# Patient Record
Sex: Female | Born: 1960 | Race: White | Hispanic: No | Smoking: Never smoker
Health system: Southern US, Community
[De-identification: ages and names within clinical notes are randomized; demographics above are authoritative.]

## PROBLEM LIST (undated history)

## (undated) DIAGNOSIS — C189 Malignant neoplasm of colon, unspecified: Secondary | ICD-10-CM

## (undated) DIAGNOSIS — Z789 Other specified health status: Secondary | ICD-10-CM

## (undated) HISTORY — DX: Malignant neoplasm of colon, unspecified: C18.9

---

## 2007-06-28 ENCOUNTER — Ambulatory Visit: Payer: Self-pay

## 2010-07-17 ENCOUNTER — Ambulatory Visit: Payer: Self-pay

## 2010-12-29 ENCOUNTER — Ambulatory Visit (INDEPENDENT_AMBULATORY_CARE_PROVIDER_SITE_OTHER): Payer: BC Managed Care – PPO | Admitting: Internal Medicine

## 2010-12-29 ENCOUNTER — Encounter: Payer: Self-pay | Admitting: Internal Medicine

## 2010-12-29 DIAGNOSIS — N951 Menopausal and female climacteric states: Secondary | ICD-10-CM

## 2010-12-29 DIAGNOSIS — Z8639 Personal history of other endocrine, nutritional and metabolic disease: Secondary | ICD-10-CM | POA: Insufficient documentation

## 2010-12-29 DIAGNOSIS — E039 Hypothyroidism, unspecified: Secondary | ICD-10-CM

## 2010-12-29 DIAGNOSIS — Z124 Encounter for screening for malignant neoplasm of cervix: Secondary | ICD-10-CM

## 2010-12-29 DIAGNOSIS — Z13220 Encounter for screening for lipoid disorders: Secondary | ICD-10-CM

## 2010-12-29 DIAGNOSIS — Z1211 Encounter for screening for malignant neoplasm of colon: Secondary | ICD-10-CM

## 2010-12-29 DIAGNOSIS — R5381 Other malaise: Secondary | ICD-10-CM

## 2010-12-29 DIAGNOSIS — R5383 Other fatigue: Secondary | ICD-10-CM

## 2010-12-29 DIAGNOSIS — E063 Autoimmune thyroiditis: Secondary | ICD-10-CM

## 2010-12-29 NOTE — Assessment & Plan Note (Signed)
currenlty resolved per patient.  Will repeat thyroid studies and CK in early January

## 2010-12-29 NOTE — Assessment & Plan Note (Signed)
Normal May 2011.  Repeat due next year

## 2010-12-29 NOTE — Patient Instructions (Signed)
check to see if you received the TdaP vaccine from th Er or Tetanus toxoid  In 2009.  Return for fasting labs in January and we'll check muscle enzyme , hormones, etc.

## 2010-12-29 NOTE — Assessment & Plan Note (Signed)
Referral for screening colonoscopy made.

## 2010-12-29 NOTE — Progress Notes (Signed)
  Subjective:    Patient ID: Crystal Erickson, female    DOB: 29-Apr-1960, 50 y.o.   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  Sent to [facility] for thyrid scan,  July 5th no uptake. C/w Hashimoto' s thyroiditis,  currenlty not on meds vc thyroid has normalized.  Only symptoms was tachycardia, now resolved.    Vewry healthy 3 uncomplicated VDs,  Oldest son is 85,  youngest is 89.  History  of insomnia severe started 4 weeks ago aggravated by night sweats ,  Now improving.  She is perimenopasual,  Last period August.  Using melatonin.  No incotience issues.  Normal PAPs, mammogram in June  2012   PAP   May 2012   Review of Systems  Constitutional: Negative for fever, chills and unexpected weight change.  HENT: Negative for hearing loss, ear pain, nosebleeds, congestion, sore throat, facial swelling, rhinorrhea, sneezing, mouth sores, trouble swallowing, neck pain, neck stiffness, voice change, postnasal drip, sinus pressure, tinnitus and ear discharge.   Eyes: Negative for pain, discharge, redness and visual disturbance.  Respiratory: Negative for cough, chest tightness, shortness of breath, wheezing and stridor.   Cardiovascular: Negative for chest pain, palpitations and leg swelling.  Musculoskeletal: Negative for myalgias and arthralgias.  Skin: Negative for color change and rash.  Neurological: Negative for dizziness, weakness, light-headedness and headaches.  Hematological: Negative for adenopathy.       Objective:   Physical Exam  Constitutional: She is oriented to person, place, and time. She appears well-developed and well-nourished.  HENT:  Mouth/Throat: Oropharynx is clear and moist.  Eyes: EOM are normal. Pupils are equal, round, and reactive to light. No scleral icterus.  Neck: Normal range of motion. Neck supple. No JVD present. No thyromegaly present.  Cardiovascular: Normal rate, regular rhythm, normal  heart sounds and intact distal pulses.   Pulmonary/Chest: Effort normal and breath sounds normal.  Abdominal: Soft. Bowel sounds are normal. She exhibits no mass. There is no tenderness.  Musculoskeletal: Normal range of motion. She exhibits no edema.  Lymphadenopathy:    She has no cervical adenopathy.  Neurological: She is alert and oriented to person, place, and time.  Skin: Skin is warm and dry.  Psychiatric: She has a normal mood and affect.          Assessment & Plan:

## 2011-07-24 ENCOUNTER — Encounter: Payer: Self-pay | Admitting: Internal Medicine

## 2011-07-24 ENCOUNTER — Ambulatory Visit (INDEPENDENT_AMBULATORY_CARE_PROVIDER_SITE_OTHER): Payer: BC Managed Care – PPO | Admitting: Internal Medicine

## 2011-07-24 ENCOUNTER — Other Ambulatory Visit (HOSPITAL_COMMUNITY)
Admission: RE | Admit: 2011-07-24 | Discharge: 2011-07-24 | Disposition: A | Discharge status: Home / Self Care | Payer: BC Managed Care – PPO | Source: Ambulatory Visit | Attending: Internal Medicine | Admitting: Internal Medicine

## 2011-07-24 VITALS — BP 102/64 | HR 69 | Temp 98.7°F | Resp 18 | Ht 64.0 in | Wt 138.0 lb

## 2011-07-24 DIAGNOSIS — Z01419 Encounter for gynecological examination (general) (routine) without abnormal findings: Secondary | ICD-10-CM | POA: Insufficient documentation

## 2011-07-24 DIAGNOSIS — N760 Acute vaginitis: Secondary | ICD-10-CM | POA: Insufficient documentation

## 2011-07-24 DIAGNOSIS — Z124 Encounter for screening for malignant neoplasm of cervix: Secondary | ICD-10-CM

## 2011-07-24 DIAGNOSIS — Z1239 Encounter for other screening for malignant neoplasm of breast: Secondary | ICD-10-CM

## 2011-07-24 DIAGNOSIS — E069 Thyroiditis, unspecified: Secondary | ICD-10-CM

## 2011-07-24 DIAGNOSIS — Z8639 Personal history of other endocrine, nutritional and metabolic disease: Secondary | ICD-10-CM

## 2011-07-24 DIAGNOSIS — Z0000 Encounter for general adult medical examination without abnormal findings: Secondary | ICD-10-CM | POA: Insufficient documentation

## 2011-07-24 DIAGNOSIS — L988 Other specified disorders of the skin and subcutaneous tissue: Secondary | ICD-10-CM

## 2011-07-24 DIAGNOSIS — Z1151 Encounter for screening for human papillomavirus (HPV): Secondary | ICD-10-CM | POA: Insufficient documentation

## 2011-07-24 DIAGNOSIS — N649 Disorder of breast, unspecified: Secondary | ICD-10-CM

## 2011-07-24 DIAGNOSIS — Z862 Personal history of diseases of the blood and blood-forming organs and certain disorders involving the immune mechanism: Secondary | ICD-10-CM

## 2011-07-24 NOTE — Patient Instructions (Addendum)
Return at your lesiure for fasting labs (ok to drink water,   8 hr fast is plenty)  Let me know which MD for your colonoscopy if yo have a preference   Mammogram

## 2011-07-26 ENCOUNTER — Encounter: Payer: Self-pay | Admitting: Internal Medicine

## 2011-07-26 NOTE — Assessment & Plan Note (Signed)
Breast pelvic and PAP done today.  mammogram ordered, colonoscopy referral in process for colon CA screening

## 2011-07-26 NOTE — Assessment & Plan Note (Signed)
Done today, last one over 2 years ago.

## 2011-07-26 NOTE — Assessment & Plan Note (Signed)
Annual TSH and Free T4 ordered.

## 2011-07-26 NOTE — Progress Notes (Signed)
Patient ID: Roanna Reaves, female   DOB: Apr 05, 1960, 51 y.o.   MRN: [id number]  Subjective:    Miquel Lamson is a 51 y.o. female who presents for an annual exam. The patient has no complaints today. The patient is sexually active. GYN screening history: last pap: approximate date 2011 and was normal. The patient wears seatbelts: yes. The patient participates in regular exercise: yes. Has the patient ever been transfused or tattooed?: no. The patient reports that there is not domestic violence in her life.   Menstrual History: OB History    Grav Para Term Preterm Abortions TAB SAB Ect Mult Living                  Menarche age: 20 Patient's last menstrual period was 08/13/2010.    The following portions of the patient's history were reviewed and updated as appropriate: allergies, current medications, past family history, past medical history, past social history, past surgical history and problem list.  Review of Systems A comprehensive review of systems was negative except for: Integument/breast: positive for skin lesion(s)    Objective:    BP 102/64  Pulse 69  Temp 98.7 F (37.1 C) (Oral)  Resp 18  Ht 5\' 4"  (1.626 m)  Wt 138 lb (62.596 kg)  BMI 23.69 kg/m2  SpO2 97%  LMP 08/13/2010  General Appearance:    Alert, cooperative, no distress, appears stated age  Head:    Normocephalic, without obvious abnormality, atraumatic  Eyes:    PERRL, conjunctiva/corneas clear, EOM's intact, fundi    benign, both eyes  Ears:    Normal TM's and external ear canals, both ears  Nose:   Nares normal, septum midline, mucosa normal, no drainage    or sinus tenderness  Throat:   Lips, mucosa, and tongue normal; teeth and gums normal  Neck:   Supple, symmetrical, trachea midline, no adenopathy;    thyroid:  no enlargement/tenderness/nodules; no carotid   bruit or JVD  Back:     Symmetric, no curvature, ROM normal, no CVA tenderness  Lungs:     Clear to auscultation bilaterally, respirations  unlabored  Chest Wall:    No tenderness or deformity   Heart:    Regular rate and rhythm, S1 and S2 normal, no murmur, rub   or gallop  Breast Exam:    No tenderness, masses, or nipple abnormality  Abdomen:     Soft, non-tender, bowel sounds active all four quadrants,    no masses, no organomegaly  Genitalia:    Normal female without lesion, discharge or tenderness     Pulses:   2+ and symmetric all extremities  Skin:   Skin color tanned and freckles in sun exposed areas, texture, turgor normal, several annular < 1 cm hyperpigmented lesion concerning for precancerous lesions (left shoulder and a left thigh)  Lymph nodes:   Cervical, supraclavicular, and axillary nodes normal  Neurologic:   CNII-XII intact, normal strength, sensation and reflexes    throughout  .      Assessment and Plan  Screening for cervical cancer Done today, last one over 2 years ago.  Routine general medical examination at a health care facility Breast pelvic and PAP done today.  mammogram ordered, colonoscopy referral in process for colon CA screening  History of Hashimoto thyroiditis Annual TSH and Free T4 ordered.    Updated Medication List Outpatient Encounter Prescriptions as of 07/24/2011  Medication Sig Dispense Refill  . Ascorbic Acid (VITAMIN C) 1000 MG tablet Take 1,000  mg by mouth 4 (four) times daily.        Marland Kitchen b complex vitamins tablet Take 1 tablet by mouth 2 (two) times daily.        . cholecalciferol (VITAMIN D) 1000 UNITS tablet Take 1,000 Units by mouth daily.        . Glucosamine-Chondroit-Vit C-Mn (GLUCOSAMINE 1500 COMPLEX PO) Take by mouth 2 (two) times daily.        Marland Kitchen GLUTAMINE PO Take by mouth 3 (three) times daily.        . Multiple Vitamins-Minerals (MULTIVITAMIN PO) Take by mouth daily.        . Omega-3 Fatty Acids (FISH OIL MAXIMUM STRENGTH) 1200 MG CPDR Take by mouth 2 (two) times daily.        . Pyridoxine HCl (VITAMIN B-6 PO) Take by mouth 2 (two) times daily.

## 2011-08-03 ENCOUNTER — Other Ambulatory Visit: Payer: BC Managed Care – PPO

## 2011-08-04 ENCOUNTER — Other Ambulatory Visit: Payer: BC Managed Care – PPO

## 2011-08-06 ENCOUNTER — Encounter: Payer: Self-pay | Admitting: Internal Medicine

## 2011-08-07 ENCOUNTER — Other Ambulatory Visit (INDEPENDENT_AMBULATORY_CARE_PROVIDER_SITE_OTHER): Payer: BC Managed Care – PPO | Admitting: *Deleted

## 2011-08-07 DIAGNOSIS — E039 Hypothyroidism, unspecified: Secondary | ICD-10-CM

## 2011-08-07 DIAGNOSIS — N951 Menopausal and female climacteric states: Secondary | ICD-10-CM

## 2011-08-07 DIAGNOSIS — R5383 Other fatigue: Secondary | ICD-10-CM

## 2011-08-07 DIAGNOSIS — Z13220 Encounter for screening for lipoid disorders: Secondary | ICD-10-CM

## 2011-08-07 DIAGNOSIS — R5381 Other malaise: Secondary | ICD-10-CM

## 2011-08-07 DIAGNOSIS — E069 Thyroiditis, unspecified: Secondary | ICD-10-CM

## 2011-08-07 LAB — TSH: TSH: 1.03 u[IU]/mL (ref 0.35–5.50)

## 2011-08-07 LAB — LIPID PANEL: Triglycerides: 45 mg/dL (ref 0.0–149.0)

## 2011-08-07 LAB — COMPREHENSIVE METABOLIC PANEL
Albumin: 3.6 g/dL (ref 3.5–5.2)
CO2: 26 mEq/L (ref 19–32)
Calcium: 8.7 mg/dL (ref 8.4–10.5)
Chloride: 104 mEq/L (ref 96–112)
GFR: 89.28 mL/min (ref >60.00)
Glucose, Bld: 92 mg/dL (ref 70–99)
Potassium: 4 mEq/L (ref 3.5–5.1)
Sodium: 138 mEq/L (ref 135–145)
Total Protein: 6.1 g/dL (ref 6.0–8.3)

## 2011-08-07 LAB — FOLLICLE STIMULATING HORMONE: FSH: 80.2 m[IU]/mL

## 2011-09-04 ENCOUNTER — Encounter: Payer: Self-pay | Admitting: Internal Medicine

## 2012-07-25 ENCOUNTER — Encounter: Payer: Self-pay | Admitting: Internal Medicine

## 2012-07-25 ENCOUNTER — Encounter: Payer: Self-pay | Admitting: *Deleted

## 2012-07-25 ENCOUNTER — Ambulatory Visit (INDEPENDENT_AMBULATORY_CARE_PROVIDER_SITE_OTHER): Payer: Self-pay | Admitting: Internal Medicine

## 2012-07-25 VITALS — BP 126/78 | HR 68 | Temp 98.1°F | Resp 12 | Ht 64.0 in | Wt 138.5 lb

## 2012-07-25 DIAGNOSIS — Z0000 Encounter for general adult medical examination without abnormal findings: Secondary | ICD-10-CM

## 2012-07-25 DIAGNOSIS — R748 Abnormal levels of other serum enzymes: Secondary | ICD-10-CM | POA: Insufficient documentation

## 2012-07-25 DIAGNOSIS — Z1239 Encounter for other screening for malignant neoplasm of breast: Secondary | ICD-10-CM

## 2012-07-25 DIAGNOSIS — Z1211 Encounter for screening for malignant neoplasm of colon: Secondary | ICD-10-CM

## 2012-07-25 DIAGNOSIS — Z124 Encounter for screening for malignant neoplasm of cervix: Secondary | ICD-10-CM

## 2012-07-25 LAB — HEPATIC FUNCTION PANEL
ALT: 59 U/L — ABNORMAL HIGH (ref 0–35)
Total Protein: 6.2 g/dL (ref 6.0–8.3)

## 2012-07-25 NOTE — Assessment & Plan Note (Signed)
Annual comprehensive exam was done including breast, pelvic exam. All screenings have been addressed .  

## 2012-07-25 NOTE — Patient Instructions (Addendum)
You had your annual  wellness exam today  We will schedule your mammogram today and your colonoscopy with Dr Donnalee Curry  We will contact you with the bloodwork results

## 2012-07-25 NOTE — Assessment & Plan Note (Signed)
liver enzyme remains elevated.  She is a Systems analyst and exercises heavily but I have recommended that she return  for additional blood tests to rule out various infections and autoimmune disorders that can elevated liver enzymes (hepatitis, hemochromatosis, etc)

## 2012-07-25 NOTE — Assessment & Plan Note (Signed)
Referral to [facility] for colonoscopy.

## 2012-07-25 NOTE — Progress Notes (Signed)
Patient ID: Crystal Erickson, female   DOB: 1961-01-03, 52 y.o.   MRN: [id number]  Subjective:     Crystal Erickson is a 52 y.o. female and is here for a comprehensive physical exam. The patient reports no problems.  History   Social History  . Marital Status: Divorced    Spouse Name: N/A    Number of Children: N/A  . Years of Education: N/A   Occupational History  . personal trainer    Social History Main Topics  . Smoking status: Never Smoker   . Smokeless tobacco: Never Used  . Alcohol Use: 0.0 oz/week    0 drink(s) per week  . Drug Use: No  . Sexually Active: Not on file   Other Topics Concern  . Not on file   Social History Narrative  . No narrative on file   Health Maintenance  Topic Date Due  . Tetanus/tdap  06/17/1979  . Colonoscopy  06/17/2010  . Influenza Vaccine  09/12/2012  . Mammogram  07/27/2013  . Pap Smear  07/24/2014    The following portions of the patient's history were reviewed and updated as appropriate: allergies, current medications, past family history, past medical history, past social history, past surgical history and problem list.  Review of Systems A comprehensive review of systems was negative.   Objective:   BP 126/78  Pulse 68  Temp(Src) 98.1 F (36.7 C) (Oral)  Resp 12  Ht 5\' 4"  (1.626 m)  Wt 138 lb 8 oz (62.823 kg)  BMI 23.76 kg/m2  SpO2 97%  LMP 08/13/2010  General Appearance:    Alert, cooperative, no distress, appears stated age  Head:    Normocephalic, without obvious abnormality, atraumatic  Eyes:    PERRL, conjunctiva/corneas clear, EOM's intact, fundi    benign, both eyes  Ears:    Normal TM's and external ear canals, both ears  Nose:   Nares normal, septum midline, mucosa normal, no drainage    or sinus tenderness  Throat:   Lips, mucosa, and tongue normal; teeth and gums normal  Neck:   Supple, symmetrical, trachea midline, no adenopathy;    thyroid:  no enlargement/tenderness/nodules; no carotid   bruit or JVD   Back:     Symmetric, no curvature, ROM normal, no CVA tenderness  Lungs:     Clear to auscultation bilaterally, respirations unlabored  Chest Wall:    No tenderness or deformity   Heart:    Regular rate and rhythm, S1 and S2 normal, no murmur, rub   or gallop  Breast Exam:    No tenderness, masses, or nipple abnormality  Abdomen:     Soft, non-tender, bowel sounds active all four quadrants,    no masses, no organomegaly  Genitalia:    Pelvic: cervix normal in appearance, external genitalia normal, no adnexal masses or tenderness, no cervical motion tenderness, rectovaginal septum normal, uterus normal size, shape, and consistency and vagina normal without discharge  Extremities:   Extremities normal, atraumatic, no cyanosis or edema  Pulses:   2+ and symmetric all extremities  Skin:   Skin color, texture, turgor normal, no rashes or lesions  Lymph nodes:   Cervical, supraclavicular, and axillary nodes normal  Neurologic:   CNII-XII intact, normal strength, sensation and reflexes    throughout     Assessment:   Elevated liver enzymes  liver enzyme remains elevated.  She is a Systems analyst and exercises heavily but I have recommended that she return  for additional blood tests to  rule out various infections and autoimmune disorders that can elevated liver enzymes (hepatitis, hemochromatosis, etc)   Routine general medical examination at a health care facility Annual comprehensive exam was done including breast, pelvic exam. All screenings have been addressed .   Screening for cervical cancer Normal PAP smear 2013.  Screening for colon cancer Referral to [facility] for colonoscopy.    Updated Medication List Outpatient Encounter Prescriptions as of 07/25/2012  Medication Sig Dispense Refill  . Ascorbic Acid (VITAMIN C) 1000 MG tablet Take 1,000 mg by mouth 4 (four) times daily.        Marland Kitchen b complex vitamins tablet Take 1 tablet by mouth 2 (two) times daily.        .  Glucosamine-Chondroit-Vit C-Mn (GLUCOSAMINE 1500 COMPLEX PO) Take by mouth 2 (two) times daily.        Marland Kitchen GLUTAMINE PO Take by mouth 3 (three) times daily.        . Multiple Vitamins-Minerals (MULTIVITAMIN PO) Take by mouth daily.        . Omega-3 Fatty Acids (FISH OIL MAXIMUM STRENGTH) 1200 MG CPDR Take by mouth 2 (two) times daily.        . Pyridoxine HCl (VITAMIN B-6 PO) Take by mouth 2 (two) times daily.        . cholecalciferol (VITAMIN D) 1000 UNITS tablet Take 1,000 Units by mouth daily.         No facility-administered encounter medications on file as of 07/25/2012.

## 2012-07-25 NOTE — Assessment & Plan Note (Signed)
Normal PAP smear 2013.

## 2012-08-15 ENCOUNTER — Encounter: Payer: Self-pay | Admitting: General Surgery

## 2012-08-15 ENCOUNTER — Ambulatory Visit (INDEPENDENT_AMBULATORY_CARE_PROVIDER_SITE_OTHER): Payer: BC Managed Care – PPO | Admitting: General Surgery

## 2012-08-15 ENCOUNTER — Other Ambulatory Visit: Payer: Self-pay | Admitting: General Surgery

## 2012-08-15 VITALS — BP 110/80 | HR 76 | Resp 12 | Ht 64.0 in | Wt 136.0 lb

## 2012-08-15 DIAGNOSIS — Z1211 Encounter for screening for malignant neoplasm of colon: Secondary | ICD-10-CM

## 2012-08-15 MED ORDER — POLYETHYLENE GLYCOL 3350 17 GM/SCOOP PO POWD: ORAL | Status: DC

## 2012-08-15 NOTE — Patient Instructions (Addendum)
Colonoscopy A colonoscopy is an exam to evaluate your entire colon. In this exam, your colon is cleansed. A long fiberoptic tube is inserted through your rectum and into your colon. The fiberoptic scope (endoscope) is a long bundle of enclosed and very flexible fibers. These fibers transmit light to the area examined and send images from that area to your caregiver. Discomfort is usually minimal. You may be given a drug to help you sleep (sedative) during or prior to the procedure. This exam helps to detect lumps (tumors), polyps, inflammation, and areas of bleeding. Your caregiver may also take a small piece of tissue (biopsy) that will be examined under a microscope. LET YOUR CAREGIVER KNOW ABOUT:   Allergies to food or medicine.  Medicines taken, including vitamins, herbs, eyedrops, over-the-counter medicines, and creams.  Use of steroids (by mouth or creams).  Previous problems with anesthetics or numbing medicines.  History of bleeding problems or blood clots.  Previous surgery.  Other health problems, including diabetes and kidney problems.  Possibility of pregnancy, if this applies. BEFORE THE PROCEDURE   A clear liquid diet may be required for 2 days before the exam.  Ask your caregiver about changing or stopping your regular medications.  Liquid injections (enemas) or laxatives may be required.  A large amount of electrolyte solution may be given to you to drink over a short period of time. This solution is used to clean out your colon.  You should be present 60 minutes prior to your procedure or as directed by your caregiver. AFTER THE PROCEDURE   If you received a sedative or pain relieving medication, you will need to arrange for someone to drive you home.  Occasionally, there is a little blood passed with the first bowel movement. Do not be concerned. FINDING OUT THE RESULTS OF YOUR TEST Not all test results are available during your visit. If your test results are  not back during the visit, make an appointment with your caregiver to find out the results. Do not assume everything is normal if you have not heard from your caregiver or the medical facility. It is important for you to follow up on all of your test results. HOME CARE INSTRUCTIONS   It is not unusual to pass moderate amounts of gas and experience mild abdominal cramping following the procedure. This is due to air being used to inflate your colon during the exam. Walking or a warm pack on your belly (abdomen) may help.  You may resume all normal meals and activities after sedatives and medicines have worn off.  Only take over-the-counter or prescription medicines for pain, discomfort, or fever as directed by your caregiver. Do not use aspirin or blood thinners if a biopsy was taken. Consult your caregiver for medicine usage if biopsies were taken. SEEK IMMEDIATE MEDICAL CARE IF:   You have a fever.  You pass large blood clots or fill a toilet with blood following the procedure. This may also occur 10 to 14 days following the procedure. This is more likely if a biopsy was taken.  You develop abdominal pain that keeps getting worse and cannot be relieved with medicine. Document Released: 12/27/1999 Document Revised: 03/23/2011 Document Reviewed: 08/11/2007 [facility] Patient Information 2014 [place], [place].  Patient has been scheduled for a colonoscopy on 09-28-12 at [facility]. This patient has been asked to discontinue fish oil one week prior to procedure.

## 2012-08-15 NOTE — Progress Notes (Signed)
Patient ID: Crystal Erickson, female   DOB: 02-Jul-1960, 52 y.o.   MRN: [id number]  Chief Complaint  Patient presents with  . Other    new patient evaluation for screening colonoscopy    HPI Crystal Erickson is a 52 y.o. female who presents for an evaluation for a screening colonoscopy non prior.Patient states no family history of colon ca. HPI  History reviewed. No pertinent past medical history.  History reviewed. No pertinent past surgical history.   Family History  Problem Relation Age of Onset  . Mental illness Mother 19    alzheimer's  . Cancer Mother     metastatic unknown primary  . Diabetes Father     Social History History  Substance Use Topics  . Smoking status: Never Smoker   . Smokeless tobacco: Never Used  . Alcohol Use: 0.0 oz/week    0 drink(s) per week    No Known Allergies  Current Outpatient Prescriptions  Medication Sig Dispense Refill  . Ascorbic Acid (VITAMIN C) 1000 MG tablet Take 1,000 mg by mouth 4 (four) times daily.        Marland Kitchen b complex vitamins tablet Take 1 tablet by mouth 2 (two) times daily.        . cholecalciferol (VITAMIN D) 1000 UNITS tablet Take 1,000 Units by mouth daily.        . Glucosamine-Chondroit-Vit C-Mn (GLUCOSAMINE 1500 COMPLEX PO) Take by mouth 2 (two) times daily.        Marland Kitchen GLUTAMINE PO Take by mouth 3 (three) times daily.        . Multiple Vitamins-Minerals (MULTIVITAMIN PO) Take by mouth daily.        . Omega-3 Fatty Acids (FISH OIL MAXIMUM STRENGTH) 1200 MG CPDR Take by mouth 2 (two) times daily.        . Pyridoxine HCl (VITAMIN B-6 PO) Take by mouth 2 (two) times daily.        . polyethylene glycol powder (GLYCOLAX/MIRALAX) powder 255 grams one bottle for colonoscopy prep  255 g  0   No current facility-administered medications for this visit.    Review of Systems Review of Systems  Constitutional: Negative.   Respiratory: Negative.   Cardiovascular: Negative.   Gastrointestinal: Negative.     Blood pressure 110/80, pulse  76, resp. rate 12, height 5\' 4"  (1.626 m), weight 136 lb (61.689 kg), last menstrual period 08/13/2010.  Physical Exam Physical Exam  Constitutional: She is oriented to person, place, and time. She appears well-developed and well-nourished.  Cardiovascular: Normal rate, regular rhythm and normal heart sounds.   Pulmonary/Chest: Breath sounds normal.  Lymphadenopathy:    She has no cervical adenopathy.  Neurological: She is alert and oriented to person, place, and time.  Skin: Skin is warm and dry.    Data Reviewed PCP notes  Assessment    Candidate for screening endoscopy.    Plan    Indications for the procedure were reviewed. Risks and benefits including those related to bleeding and perforation were discussed. The need to have a driver the day of the procedure was reviewed.    Patient has been scheduled for a colonoscopy on 09-28-12 at [facility]. This patient has been asked to discontinue fish oil one week prior to procedure.   Earline Mayotte 08/15/2012, 6:47 PM

## 2012-09-09 ENCOUNTER — Ambulatory Visit
Admission: RE | Admit: 2012-09-09 | Discharge: 2012-09-09 | Disposition: A | Discharge status: Home / Self Care | Payer: BC Managed Care – PPO | Source: Ambulatory Visit | Attending: Internal Medicine | Admitting: Internal Medicine

## 2012-09-09 DIAGNOSIS — Z1239 Encounter for other screening for malignant neoplasm of breast: Secondary | ICD-10-CM

## 2012-09-16 ENCOUNTER — Other Ambulatory Visit: Payer: BC Managed Care – PPO

## 2012-09-21 ENCOUNTER — Telehealth: Payer: Self-pay | Admitting: *Deleted

## 2012-09-21 NOTE — Telephone Encounter (Signed)
Patient reports no medication changes since last office visit. She was instructed to pre-register since she has not done so already. We will proceed with colonoscopy that is scheduled at [facility] for 09-28-12. Patient to call the office if she has further questions.

## 2012-09-28 ENCOUNTER — Ambulatory Visit: Payer: Self-pay | Admitting: General Surgery

## 2012-09-28 DIAGNOSIS — Z1211 Encounter for screening for malignant neoplasm of colon: Secondary | ICD-10-CM

## 2012-09-28 LAB — HM COLONOSCOPY: HM Colonoscopy: NORMAL

## 2012-09-29 ENCOUNTER — Encounter: Payer: Self-pay | Admitting: General Surgery

## 2012-10-03 ENCOUNTER — Telehealth: Payer: Self-pay | Admitting: Internal Medicine

## 2012-11-17 ENCOUNTER — Other Ambulatory Visit: Payer: Self-pay

## 2013-05-01 ENCOUNTER — Ambulatory Visit (INDEPENDENT_AMBULATORY_CARE_PROVIDER_SITE_OTHER): Payer: BC Managed Care – PPO | Admitting: Internal Medicine

## 2013-05-01 ENCOUNTER — Encounter: Payer: Self-pay | Admitting: Internal Medicine

## 2013-05-01 VITALS — BP 114/76 | HR 72 | Temp 98.3°F | Resp 18 | Wt 136.5 lb

## 2013-05-01 DIAGNOSIS — R5383 Other fatigue: Secondary | ICD-10-CM

## 2013-05-01 DIAGNOSIS — R142 Eructation: Secondary | ICD-10-CM

## 2013-05-01 DIAGNOSIS — R143 Flatulence: Secondary | ICD-10-CM

## 2013-05-01 DIAGNOSIS — F489 Nonpsychotic mental disorder, unspecified: Secondary | ICD-10-CM

## 2013-05-01 DIAGNOSIS — Z862 Personal history of diseases of the blood and blood-forming organs and certain disorders involving the immune mechanism: Secondary | ICD-10-CM

## 2013-05-01 DIAGNOSIS — R634 Abnormal weight loss: Secondary | ICD-10-CM

## 2013-05-01 DIAGNOSIS — F419 Anxiety disorder, unspecified: Secondary | ICD-10-CM

## 2013-05-01 DIAGNOSIS — F418 Other specified anxiety disorders: Secondary | ICD-10-CM

## 2013-05-01 DIAGNOSIS — R748 Abnormal levels of other serum enzymes: Secondary | ICD-10-CM

## 2013-05-01 DIAGNOSIS — R141 Gas pain: Secondary | ICD-10-CM

## 2013-05-01 DIAGNOSIS — M791 Myalgia, unspecified site: Secondary | ICD-10-CM

## 2013-05-01 DIAGNOSIS — F341 Dysthymic disorder: Secondary | ICD-10-CM

## 2013-05-01 DIAGNOSIS — R140 Abdominal distension (gaseous): Secondary | ICD-10-CM

## 2013-05-01 DIAGNOSIS — F411 Generalized anxiety disorder: Secondary | ICD-10-CM

## 2013-05-01 DIAGNOSIS — Z780 Asymptomatic menopausal state: Secondary | ICD-10-CM

## 2013-05-01 DIAGNOSIS — IMO0001 Reserved for inherently not codable concepts without codable children: Secondary | ICD-10-CM

## 2013-05-01 DIAGNOSIS — N951 Menopausal and female climacteric states: Secondary | ICD-10-CM

## 2013-05-01 DIAGNOSIS — Z8639 Personal history of other endocrine, nutritional and metabolic disease: Secondary | ICD-10-CM

## 2013-05-01 DIAGNOSIS — R5381 Other malaise: Secondary | ICD-10-CM

## 2013-05-01 DIAGNOSIS — F5105 Insomnia due to other mental disorder: Secondary | ICD-10-CM

## 2013-05-01 LAB — COMPREHENSIVE METABOLIC PANEL
ALBUMIN: 3.3 g/dL — AB (ref 3.5–5.2)
ALK PHOS: 72 U/L (ref 39–117)
ALT: 50 U/L — AB (ref 0–35)
AST: 58 U/L — ABNORMAL HIGH (ref 0–37)
BUN: 28 mg/dL — ABNORMAL HIGH (ref 6–23)
CO2: 29 mEq/L (ref 19–32)
Calcium: 9.3 mg/dL (ref 8.4–10.5)
Chloride: 102 mEq/L (ref 96–112)
Creatinine, Ser: 0.7 mg/dL (ref 0.4–1.2)
GFR: 94.64 mL/min (ref >60.00)
GLUCOSE: 88 mg/dL (ref 70–99)
POTASSIUM: 3.9 meq/L (ref 3.5–5.1)
SODIUM: 139 meq/L (ref 135–145)
TOTAL PROTEIN: 5.8 g/dL — AB (ref 6.0–8.3)
Total Bilirubin: 0.9 mg/dL (ref 0.3–1.2)

## 2013-05-01 LAB — CBC WITH DIFFERENTIAL/PLATELET
BASOS ABS: 0 10*3/uL (ref 0.0–0.1)
Basophils Relative: 0.6 % (ref 0.0–3.0)
Eosinophils Absolute: 0 10*3/uL (ref 0.0–0.7)
Eosinophils Relative: 0.4 % (ref 0.0–5.0)
HCT: 41.2 % (ref 36.0–46.0)
Hemoglobin: 13.9 g/dL (ref 12.0–15.0)
Lymphocytes Relative: 37.6 % (ref 12.0–46.0)
Lymphs Abs: 2 10*3/uL (ref 0.7–4.0)
MCHC: 33.8 g/dL (ref 30.0–36.0)
MCV: 93 fl (ref 78.0–100.0)
MONOS PCT: 3.9 % (ref 3.0–12.0)
Monocytes Absolute: 0.2 10*3/uL (ref 0.1–1.0)
NEUTROS PCT: 57.5 % (ref 43.0–77.0)
Neutro Abs: 3.1 10*3/uL (ref 1.4–7.7)
PLATELETS: 137 10*3/uL — AB (ref 150.0–400.0)
RBC: 4.43 Mil/uL (ref 3.87–5.11)
RDW: 13.3 % (ref 11.5–14.6)
WBC: 5.4 10*3/uL (ref 4.5–10.5)

## 2013-05-01 LAB — T4, FREE: FREE T4: 0.55 ng/dL — AB (ref 0.60–1.60)

## 2013-05-01 LAB — CK: Total CK: 462 U/L — ABNORMAL HIGH (ref 7–177)

## 2013-05-01 LAB — TSH: TSH: 1.56 u[IU]/mL (ref 0.35–5.50)

## 2013-05-01 MED ORDER — ALPRAZOLAM 0.5 MG PO TABS: 0.5000 mg | ORAL_TABLET | Freq: Every evening | ORAL | Status: DC | PRN

## 2013-05-01 MED ORDER — PAROXETINE HCL 10 MG PO TABS: 10.0000 mg | ORAL_TABLET | Freq: Every day | ORAL | Status: DC

## 2013-05-01 NOTE — Progress Notes (Signed)
Patient ID: Crystal Erickson, female   DOB: 07-29-1960, 53 y.o.   MRN: [id number]  Patient Active Problem List   Diagnosis Date Noted  . Depression with anxiety 05/02/2013  . Abdominal bloating 05/02/2013  . Elevated liver enzymes 07/25/2012  . Routine general medical examination at a health care facility 07/24/2011  . Screening for colon cancer 12/29/2010  . History of Hashimoto thyroiditis 12/29/2010  . Screening for cervical cancer 12/29/2010    Subjective:  CC:   Chief Complaint  Patient presents with  . Follow-up    check thyroid    HPI:   Crystal Erickson is a 53 y.o. female who presents for  1) Persistent feelings of depression accompanied by anxiety,  Insomnia, tearfulness. Symptoms present for the last 6 months.  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  [place] from 4:30 am to 8 or 9 PM 5 to 6 days per week.  Averaging less than 6 hours of sleep per night for months.,  Has been dreading going to work for the past month.    Took 7 days of vacation with husband off and caught up on sleep lost.   2) Abdominal  Bloating with breast tenderness , lasted for two weeks, still not resolved.  Moves bowels regularly,  Post menopausal,  And for  the past 2 weeks ha shad  A lot of mucous in her urine .    No past medical history on file.  No past surgical history on file.     The following portions of the patient's history were reviewed and updated as appropriate: Allergies, current medications, and problem list.    Review of Systems:   Patient denies headache, fevers, malaise, unintentional weight loss, skin rash, eye pain, sinus congestion and sinus pain, sore throat, dysphagia,  hemoptysis , cough, dyspnea, wheezing, chest pain, palpitations, orthopnea, edema, abdominal pain, nausea, melena, diarrhea, constipation, flank pain, dysuria,  hematuria, urinary  Frequency, nocturia, numbness, tingling, seizures,  Focal weakness, Loss of consciousness,  Tremor, insomnia, depression, anxiety, and suicidal ideation.     History   Social History  . Marital Status: Single    Spouse Name: N/A    Number of Children: N/A  . Years of Education: N/A   Occupational History  . personal trainer    Social History Main Topics  . Smoking status: Never Smoker   . Smokeless tobacco: Never Used  . Alcohol Use: 0.0 oz/week    0 drink(s) per week  . Drug Use: No  . Sexual Activity: Not on file   Other Topics Concern  . Not on file   Social History Narrative  . No narrative on file    Objective:  Filed Vitals:   05/01/13 1138  BP: 114/76  Pulse: 72  Temp: 98.3 F (36.8 C)  Resp: 18     General appearance: alert, cooperative and appears stated age Ears: normal TM's and external ear canals both ears Throat: lips, mucosa, and tongue normal; teeth and gums normal Neck: no adenopathy, no carotid bruit, supple, symmetrical, trachea midline and thyroid not enlarged, symmetric, no tenderness/mass/nodules Back: symmetric, no curvature. ROM normal. No CVA tenderness. Lungs: clear to auscultation bilaterally Heart: regular rate and rhythm, S1, S2 normal, no murmur, click, rub or gallop Abdomen: soft, non-tender; bowel sounds normal; no masses,  no organomegaly Pulses: 2+ and symmetric Skin: Skin color,  texture, turgor normal. No rashes or lesions Lymph nodes: Cervical, supraclavicular, and axillary nodes normal.  Assessment and Plan:  History of Hashimoto thyroiditis No signs of overactive or underactive thyroid on exam or by TSH  Lab Results  Component Value Date   TSH 1.56 05/01/2013     Elevated liver enzymes Secondary to muscle burn from workouts.  Repeat liver enzymes due.  Will rule out Hep C etc at follow up Lab Results  Component Value Date   ALT 50* 05/01/2013   AST 58* 05/01/2013   ALKPHOS 72 05/01/2013    BILITOT 0.9 05/01/2013    Lab Results  Component Value Date   CKTOTAL 462* 05/01/2013    Depression with anxiety Discussed options for medication management, advised her to set boundaries with clients due to current burn out risk,  And advised to resume talk therapy with her long time therapist,  paxil started,  Prn alprazolam for sleep and extreme emotions. The risks and benefits of benzodiazepine use were discussed with patient today including excessive sedation leading to respiratory depression,  impaired thinking/driving, and addiction.  Patient was advised to avoid concurrent use with alcohol, to use medication only as needed and not to share with others  .  She will return in one month   Abdominal bloating i the absence of weight gain ,  Constipation and menstruation.  CA 125 and transvaginal u/s ordered to rule out ovarian CA   A total of 40 minutes was spent with patient more than half of which was spent in counseling, reviewing records from other prviders and coordination of care.  Updated Medication List Outpatient Encounter Prescriptions as of 05/01/2013  Medication Sig  . Ascorbic Acid (VITAMIN C) 1000 MG tablet Take 1,000 mg by mouth 4 (four) times daily.    Marland Kitchen b complex vitamins tablet Take 1 tablet by mouth 2 (two) times daily.    . cholecalciferol (VITAMIN D) 1000 UNITS tablet Take 1,000 Units by mouth daily.    . Glucosamine-Chondroit-Vit C-Mn (GLUCOSAMINE 1500 COMPLEX PO) Take by mouth 2 (two) times daily.    Marland Kitchen GLUTAMINE PO Take by mouth 3 (three) times daily.    . Multiple Vitamins-Minerals (MULTIVITAMIN PO) Take by mouth daily.    . Omega-3 Fatty Acids (FISH OIL MAXIMUM STRENGTH) 1200 MG CPDR Take by mouth 2 (two) times daily.    . polyethylene glycol powder (GLYCOLAX/MIRALAX) powder 255 grams one bottle for colonoscopy prep  . Pyridoxine HCl (VITAMIN B-6 PO) Take by mouth 2 (two) times daily.    Marland Kitchen ALPRAZolam (XANAX) 0.5 MG tablet Take 1 tablet (0.5 mg total) by mouth at  bedtime as needed for anxiety.  Marland Kitchen PARoxetine (PAXIL) 10 MG tablet Take 1 tablet (10 mg total) by mouth daily.     Orders Placed This Encounter  Procedures  . US Transvaginal Non-OB  . CA 125  . TSH  . CBC with Differential  . Comp Met (CMET)  . T4, free  . CK    No Follow-up on file.

## 2013-05-01 NOTE — Progress Notes (Signed)
Pre-visit discussion using our clinic review tool. No additional management support is needed unless otherwise documented below in the visit note.  

## 2013-05-01 NOTE — Patient Instructions (Addendum)
We are starting low dose paxil for you depression and anxiety.  Start with 1/2 tablet daily with food for the first few days.   Increase paxil to full tablet with food after a few days  Alprazolam 1/2 to 1 tablet 30 minutes before bedtime  You need a minimum of 8  Hours of sleep for the next month!!  Return in one month   Abdominal ultrasound and CA 125 to investigate your bloating symptoms and rule out ovarian CA

## 2013-05-02 ENCOUNTER — Encounter: Payer: Self-pay | Admitting: Internal Medicine

## 2013-05-02 DIAGNOSIS — R140 Abdominal distension (gaseous): Secondary | ICD-10-CM | POA: Insufficient documentation

## 2013-05-02 DIAGNOSIS — F418 Other specified anxiety disorders: Secondary | ICD-10-CM | POA: Insufficient documentation

## 2013-05-02 LAB — CA 125: CA 125: 17.4 U/mL (ref 0.0–30.2)

## 2013-05-02 NOTE — Addendum Note (Signed)
Addended by: Crecencio Mc on: 05/02/2013 11:59 AM   Modules accepted: Orders

## 2013-05-02 NOTE — Assessment & Plan Note (Signed)
No signs of overactive or underactive thyroid on exam or by TSH  Lab Results  Component Value Date   TSH 1.56 05/01/2013

## 2013-05-02 NOTE — Assessment & Plan Note (Signed)
Secondary to muscle burn from workouts.  Repeat liver enzymes due.  Will rule out Hep C etc at follow up Lab Results  Component Value Date   ALT 50* 05/01/2013   AST 58* 05/01/2013   ALKPHOS 72 05/01/2013   BILITOT 0.9 05/01/2013    Lab Results  Component Value Date   CKTOTAL 462* 05/01/2013

## 2013-05-02 NOTE — Assessment & Plan Note (Addendum)
Discussed options for medication management, advised her to set boundaries with clients due to current burn out risk,  And advised to resume talk therapy with her long time therapist,  paxil started,  Prn alprazolam for sleep and extreme emotions. The risks and benefits of benzodiazepine use were discussed with patient today including excessive sedation leading to respiratory depression,  impaired thinking/driving, and addiction.  Patient was advised to avoid concurrent use with alcohol, to use medication only as needed and not to share with others  .  She will return in one month

## 2013-05-02 NOTE — Assessment & Plan Note (Signed)
i the absence of weight gain ,  Constipation and menstruation.  CA 125 and transvaginal u/s ordered to rule out ovarian CA

## 2013-05-04 NOTE — Telephone Encounter (Signed)
Mailed unread message to pt  

## 2013-05-05 ENCOUNTER — Ambulatory Visit: Payer: Self-pay | Admitting: Internal Medicine

## 2013-05-05 IMAGING — US US PELV - US TRANSVAGINAL
1 series · 14 of 25 positions shown · non-contrast
Comparison: None

CLINICAL DATA: Perimenopausal bloating sensation

EXAM:
TRANSABDOMINAL AND TRANSVAGINAL ULTRASOUND OF PELVIS
TECHNIQUE: Study was performed transabdominally to optimize pelvic field of
view evaluation and transvaginally to optimize internal visceral
architecture evaluation.

[Series 1: us pelv - us transvaginal · 0.30mm/px · 14 of 79 slices shown]
[im 1/79]
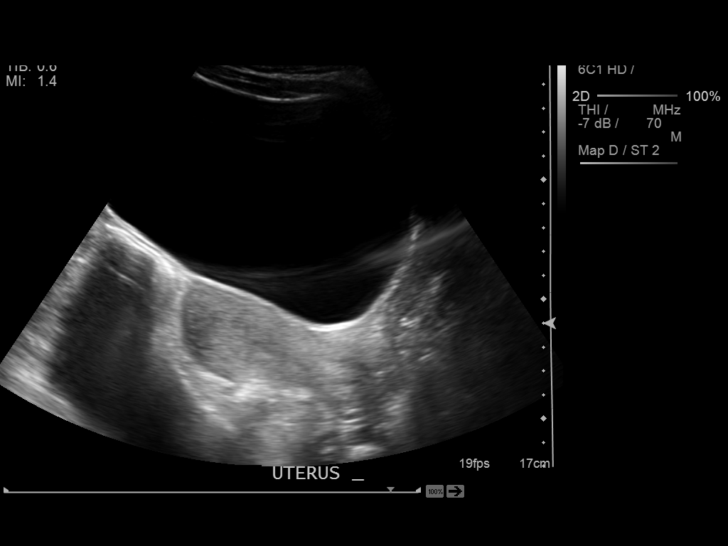
[im 7/79]
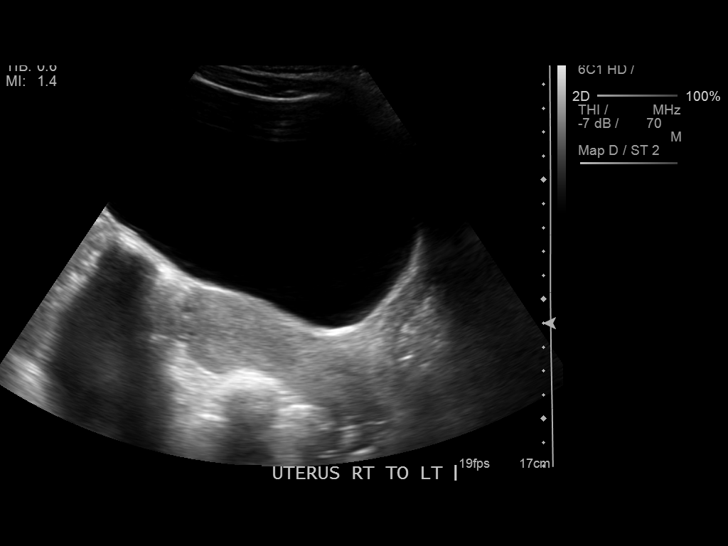
[im 14/79]
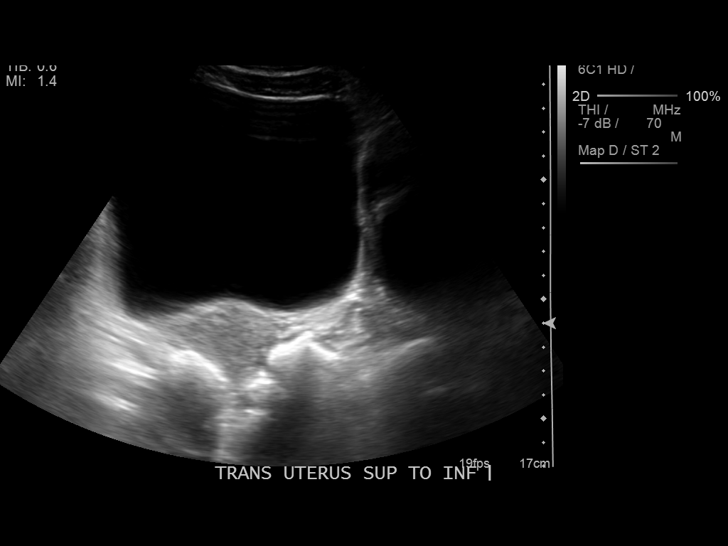
[im 20/79]
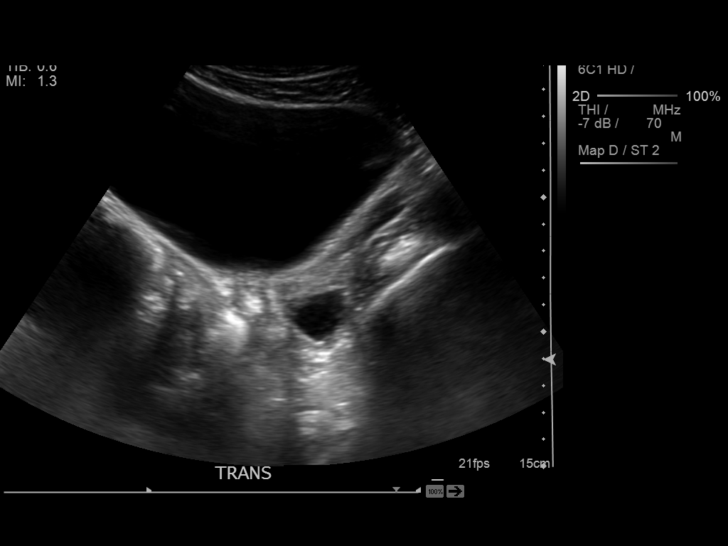
[im 27/79]
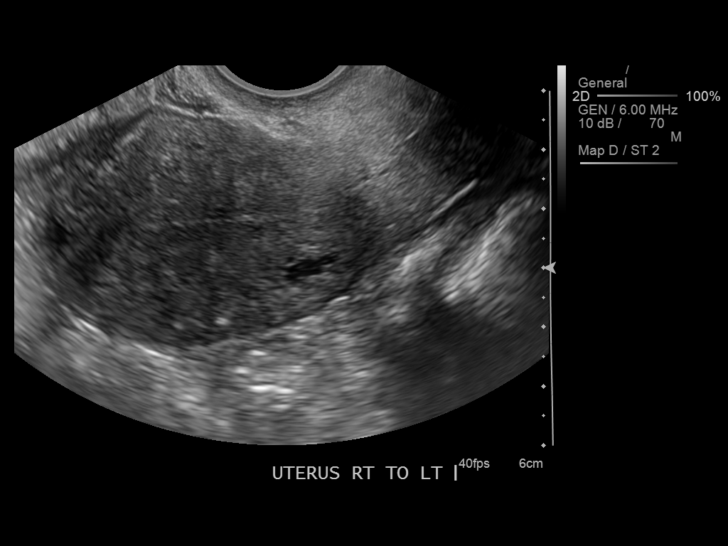
[im 30/79]
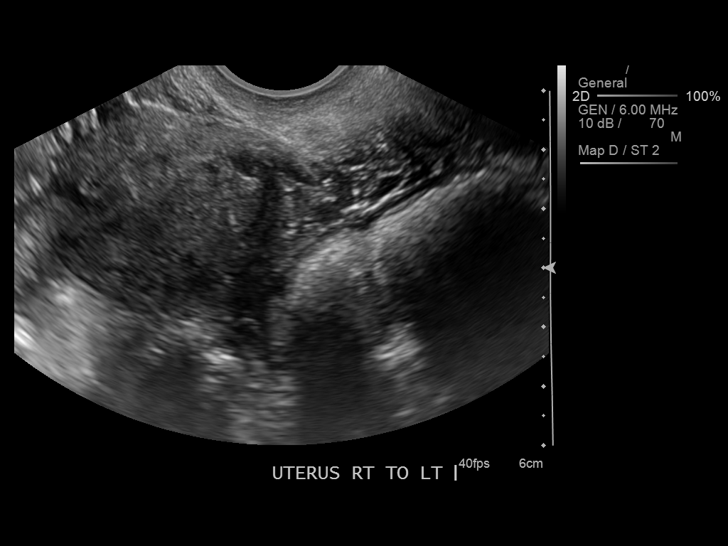
[im 36/79]
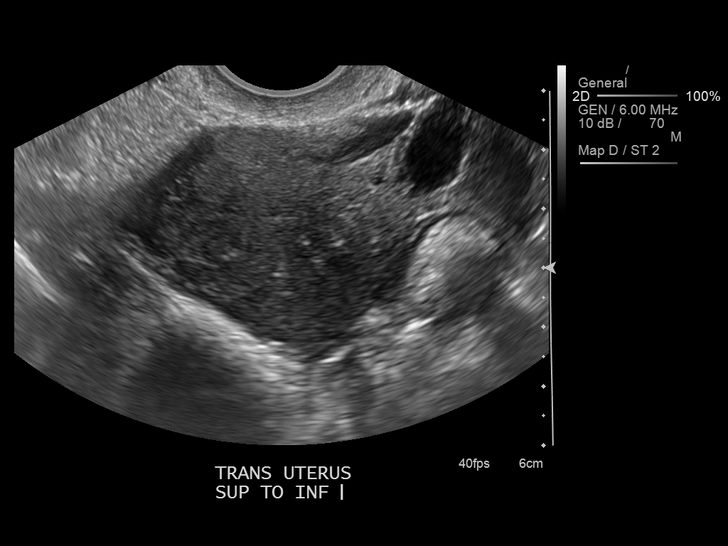
[im 43/79]
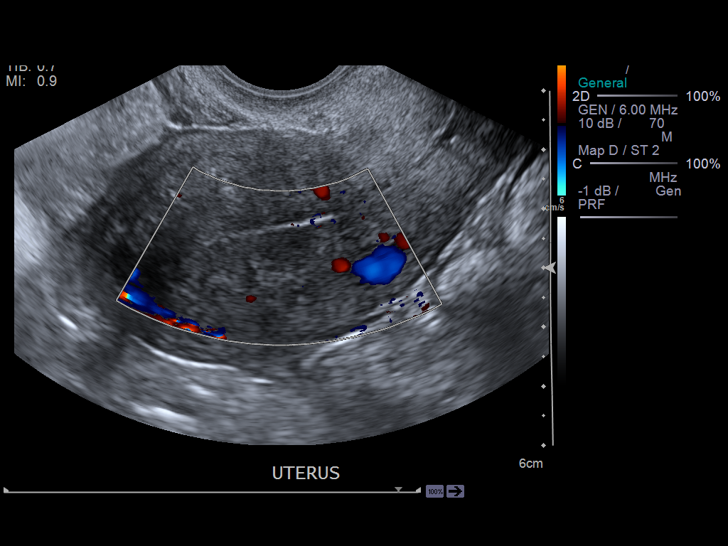
[im 49/79]
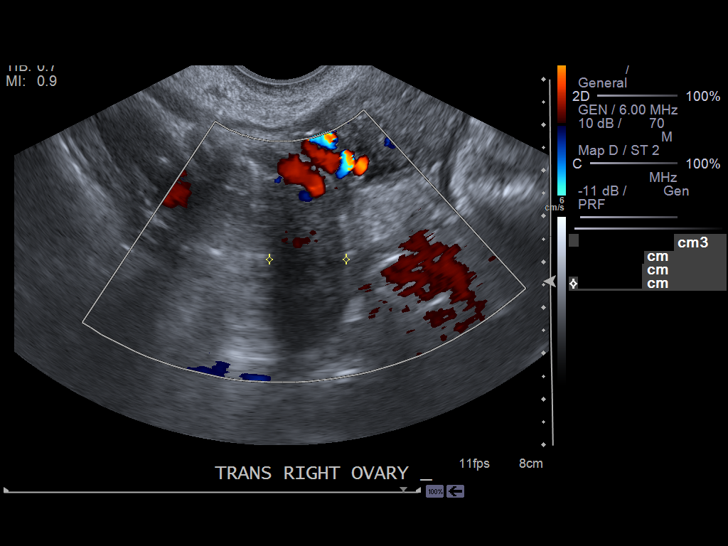
[im 53/79]
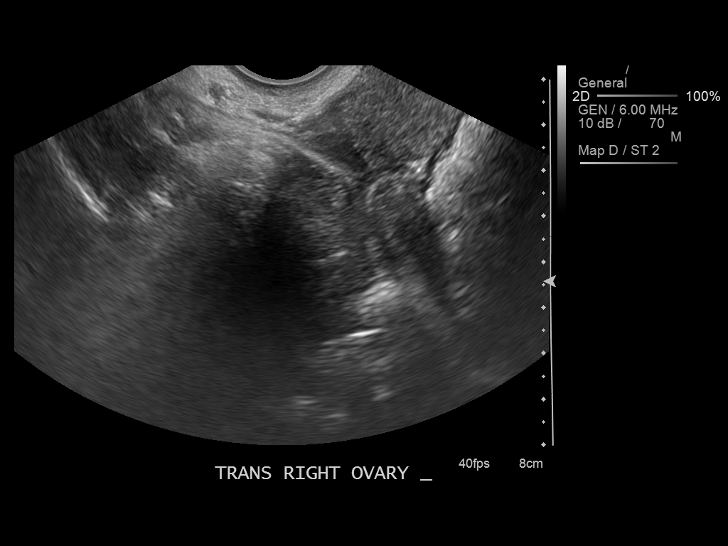
[im 59/79]
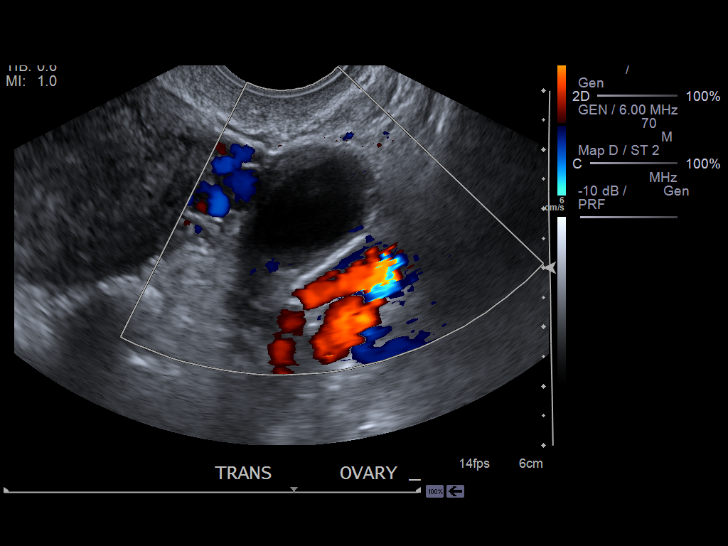
[im 66/79]
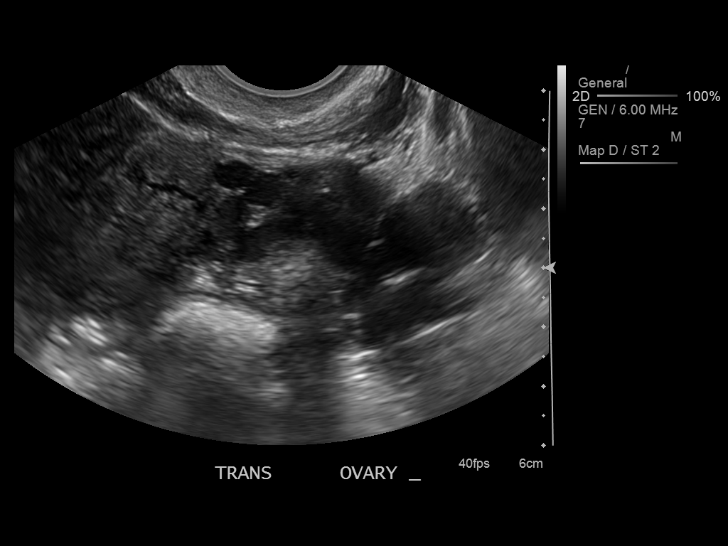
[im 72/79]
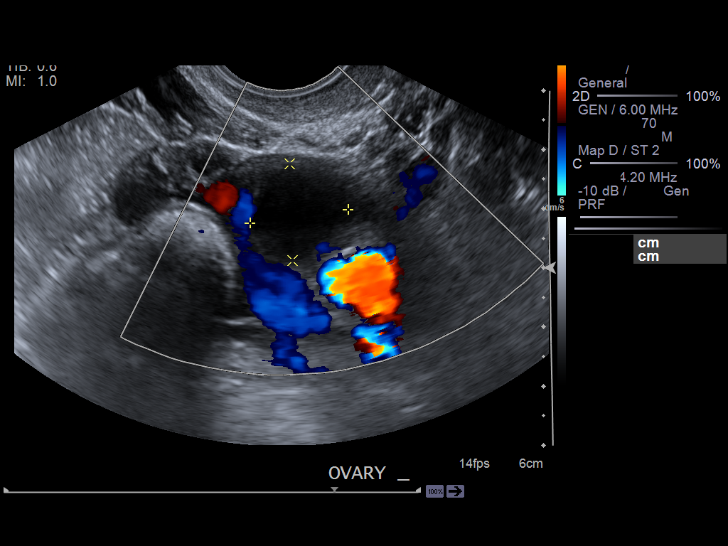
[im 79/79]
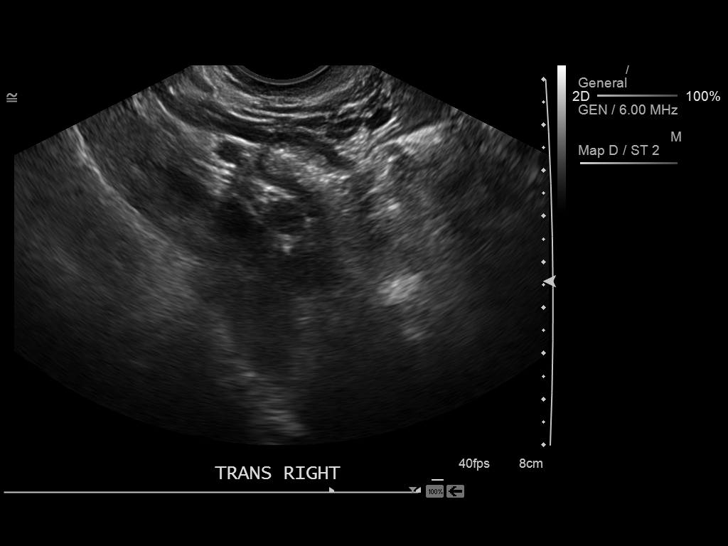

[14 of 25 positions shown; findings below may reference images not displayed]

FINDINGS: Uterus measures 9.6 x 3.4 x 5.5 cm. Uterus is anteverted. There is
no intrauterine mass. Endometrium measures 2 mm in thickness with a
smooth contour.

Right ovary measures 3.5 x 2.7 x 1.7 cm in size. Left ovary measures
2.6 x 2.1 x 2.1 cm in size. There is a small cyst in the left ovary
measuring 1.7 x 1.6 x 1.4 cm. There is no other extrauterine pelvic
or adnexal mass. There is no appreciable free fluid in the pelvis.
IMPRESSION: Small simple cyst left ovary measuring 1.7 x 1.6 x 1.4 cm. Study
otherwise unremarkable. Note that there is no appreciable
endometrial thickening.

## 2013-05-09 ENCOUNTER — Telehealth: Payer: Self-pay | Admitting: Internal Medicine

## 2013-05-09 DIAGNOSIS — R140 Abdominal distension (gaseous): Secondary | ICD-10-CM

## 2013-05-09 NOTE — Telephone Encounter (Signed)
Called and notified patient of no Ovarian cancer, as directed by Dr.  Derrel Nip.  Patient Ultra sound showed Uterus Measures 9.6X3.4X5.5 cm. Uterus is anteverted. There is no intrauterine mass. Endometrium measures 2 mm in thickness with a smooth contour.

## 2013-05-12 ENCOUNTER — Ambulatory Visit: Payer: BC Managed Care – PPO | Admitting: Internal Medicine

## 2013-05-17 ENCOUNTER — Telehealth: Payer: Self-pay | Admitting: Internal Medicine

## 2013-05-17 DIAGNOSIS — R0683 Snoring: Secondary | ICD-10-CM

## 2013-05-17 DIAGNOSIS — R5383 Other fatigue: Secondary | ICD-10-CM

## 2013-05-17 DIAGNOSIS — R5381 Other malaise: Secondary | ICD-10-CM

## 2013-05-17 DIAGNOSIS — G4710 Hypersomnia, unspecified: Secondary | ICD-10-CM

## 2013-05-17 NOTE — Telephone Encounter (Signed)
Left message for patient to return call to office. 

## 2013-05-17 NOTE — Telephone Encounter (Signed)
Pt called to check status of referral.  Advised pt of previous phone note.

## 2013-05-17 NOTE — Telephone Encounter (Signed)
Referral is in process as requested for sleep study.  Remind her to take the alprazolam with her for the night of the study  to help her fall asleep

## 2013-05-17 NOTE — Telephone Encounter (Signed)
Patient stated she has been doing the suggestions made at last visit and still not sleeping she states she is snoring to the point keeping her awake at night affecting her job after doing some reading she would like to do a sleep study.

## 2013-05-17 NOTE — Telephone Encounter (Signed)
Patient would like a sleep study referral. Please advise she comes in on 05/31/13 to see you.

## 2013-05-18 NOTE — Telephone Encounter (Signed)
Pt left vm at 4:48 p.m.  Calling to check status of referral for sleep study.  Pt states she was expecting a call today and is tired of waiting.  States if we cannot get her an appointment to give her the number of the place that will be doing the testing so that she can call them.  Sleep study referral placed 5/6.

## 2013-05-19 NOTE — Telephone Encounter (Signed)
Spoke with patient; advised may take several days to hear back from sleep study facility for scheduling.  Advised pt she will receive a call with appointment date and time.

## 2013-05-25 NOTE — Telephone Encounter (Signed)
Pt called back about sleep study facility

## 2013-05-26 ENCOUNTER — Telehealth: Payer: Self-pay | Admitting: Internal Medicine

## 2013-05-26 NOTE — Telephone Encounter (Signed)
Patient called to ask about sleep study appt. Patient advised that no orders is in the chart. Patient request that Dr. Lupita Dawn nurse call with appt information.

## 2013-05-26 NOTE — Telephone Encounter (Signed)
Patient is very verbally abusive cursing because she does not have sleep study yet stating no matter what the hell if she does not receive a phone call by 4 PM she will be here on the 20 to pick up her records i better get a F.. call, called patient and tried to explain how long it takes for referral at times, patient was yelling entire time on phone.

## 2013-05-31 ENCOUNTER — Ambulatory Visit (INDEPENDENT_AMBULATORY_CARE_PROVIDER_SITE_OTHER): Payer: BC Managed Care – PPO | Admitting: Internal Medicine

## 2013-05-31 ENCOUNTER — Encounter: Payer: Self-pay | Admitting: Internal Medicine

## 2013-05-31 VITALS — BP 114/76 | HR 68 | Temp 98.6°F | Resp 16 | Ht 64.0 in | Wt 137.2 lb

## 2013-05-31 DIAGNOSIS — R142 Eructation: Secondary | ICD-10-CM

## 2013-05-31 DIAGNOSIS — R143 Flatulence: Secondary | ICD-10-CM

## 2013-05-31 DIAGNOSIS — R748 Abnormal levels of other serum enzymes: Secondary | ICD-10-CM

## 2013-05-31 DIAGNOSIS — R0989 Other specified symptoms and signs involving the circulatory and respiratory systems: Secondary | ICD-10-CM

## 2013-05-31 DIAGNOSIS — D696 Thrombocytopenia, unspecified: Secondary | ICD-10-CM

## 2013-05-31 DIAGNOSIS — R140 Abdominal distension (gaseous): Secondary | ICD-10-CM

## 2013-05-31 DIAGNOSIS — Z8639 Personal history of other endocrine, nutritional and metabolic disease: Secondary | ICD-10-CM

## 2013-05-31 DIAGNOSIS — F418 Other specified anxiety disorders: Secondary | ICD-10-CM

## 2013-05-31 DIAGNOSIS — F341 Dysthymic disorder: Secondary | ICD-10-CM

## 2013-05-31 DIAGNOSIS — R141 Gas pain: Secondary | ICD-10-CM

## 2013-05-31 DIAGNOSIS — Z862 Personal history of diseases of the blood and blood-forming organs and certain disorders involving the immune mechanism: Secondary | ICD-10-CM

## 2013-05-31 DIAGNOSIS — R0609 Other forms of dyspnea: Secondary | ICD-10-CM

## 2013-05-31 DIAGNOSIS — R0683 Snoring: Secondary | ICD-10-CM | POA: Insufficient documentation

## 2013-05-31 DIAGNOSIS — R634 Abnormal weight loss: Secondary | ICD-10-CM

## 2013-05-31 LAB — IRON AND TIBC
%SAT: 50 % (ref 20–55)
IRON: 177 ug/dL — AB (ref 42–145)
TIBC: 354 ug/dL (ref 250–470)
UIBC: 177 ug/dL (ref 125–400)

## 2013-05-31 LAB — FERRITIN: Ferritin: 129.2 ng/mL (ref 10.0–291.0)

## 2013-05-31 LAB — CBC WITH DIFFERENTIAL/PLATELET
BASOS ABS: 0 10*3/uL (ref 0.0–0.1)
BASOS PCT: 0.7 % (ref 0.0–3.0)
Eosinophils Absolute: 0 10*3/uL (ref 0.0–0.7)
Eosinophils Relative: 1 % (ref 0.0–5.0)
HCT: 39 % (ref 36.0–46.0)
HEMOGLOBIN: 13.4 g/dL (ref 12.0–15.0)
LYMPHS PCT: 43.6 % (ref 12.0–46.0)
Lymphs Abs: 2.2 10*3/uL (ref 0.7–4.0)
MCHC: 34.3 g/dL (ref 30.0–36.0)
MCV: 93.4 fl (ref 78.0–100.0)
Monocytes Absolute: 0.2 10*3/uL (ref 0.1–1.0)
Monocytes Relative: 3.3 % (ref 3.0–12.0)
NEUTROS ABS: 2.6 10*3/uL (ref 1.4–7.7)
NEUTROS PCT: 51.4 % (ref 43.0–77.0)
Platelets: 118 10*3/uL — ABNORMAL LOW (ref 150.0–400.0)
RBC: 4.18 Mil/uL (ref 3.87–5.11)
RDW: 13.6 % (ref 11.5–15.5)
WBC: 5 10*3/uL (ref 4.0–10.5)

## 2013-05-31 LAB — HEPATIC FUNCTION PANEL
ALT: 58 U/L — AB (ref 0–35)
AST: 74 U/L — ABNORMAL HIGH (ref 0–37)
Albumin: 3.4 g/dL — ABNORMAL LOW (ref 3.5–5.2)
Alkaline Phosphatase: 68 U/L (ref 39–117)
Bilirubin, Direct: 0.1 mg/dL (ref 0.0–0.3)
TOTAL PROTEIN: 5.7 g/dL — AB (ref 6.0–8.3)
Total Bilirubin: 0.7 mg/dL (ref 0.2–1.2)

## 2013-05-31 LAB — T4, FREE: Free T4: 0.66 ng/dL (ref 0.60–1.60)

## 2013-05-31 MED ORDER — ZOLPIDEM TARTRATE 10 MG PO TABS: 10.0000 mg | ORAL_TABLET | Freq: Every evening | ORAL | Status: DC | PRN

## 2013-05-31 NOTE — Progress Notes (Signed)
Pre-visit discussion using our clinic review tool. No additional management support is needed unless otherwise documented below in the visit note.  

## 2013-05-31 NOTE — Progress Notes (Signed)
Patient ID: Crystal Erickson, female   DOB: [birth year], 53 y.o.   MRN: [id number]   Patient Active Problem List   Diagnosis Date Noted  . Thrombocytopenia, unspecified 06/01/2013  . Snoring 05/31/2013  . Depression with anxiety 05/02/2013  . Abdominal bloating 05/02/2013  . Elevated liver enzymes 07/25/2012  . Routine general medical examination at a health care facility 07/24/2011  . Screening for colon cancer 12/29/2010  . History of Hashimoto thyroiditis 12/29/2010  . Screening for cervical cancer 12/29/2010    Subjective:  CC:   Chief Complaint  Patient presents with  . Follow-up    1 month    HPI:   Crystal Erickson is a 53 y.o. female who presents for Follow up on symptoms of fatigue, insomnia and anxiety, as well as abnormal labs.  She tried the medication for  Few weeks but decided she did not need it since addressing her impossible self imposed work schedule alleviated her emotional distress.  However she is concerned that she may have sleep apnea.  She has a history of chronic snoring,  Wakes up tired and often finds herself falling asleep during the day. She is scheduled to have a sleep study later on this week.   Elevated LFTS.  Repeat lfts with additional labs to rule out viruses and autoimmune syndromes are to be done today.  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  CK was elevated on last check.  Low platelets:  No history of thrombocytopenia , no history of bleeding. No excessive alcohol use or medications associated with low platelets    History reviewed. No pertinent past medical history.  History reviewed. No pertinent past surgical history.     The following portions of the patient's history were reviewed and updated as appropriate: Allergies, current medications, and problem list.    Review of Systems:   Patient denies headache, fevers, malaise, unintentional weight loss, skin rash, eye pain, sinus congestion and  sinus pain, sore throat, dysphagia,  hemoptysis , cough, dyspnea, wheezing, chest pain, palpitations, orthopnea, edema, abdominal pain, nausea, melena, diarrhea, constipation, flank pain, dysuria, hematuria, urinary  Frequency, nocturia, numbness, tingling, seizures,  Focal weakness, Loss of consciousness,  Tremor, depression, anxiety, and suicidal ideation.     History   Social History  . Marital Status: Single    Spouse Name: N/A    Number of Children: N/A  . Years of Education: N/A   Occupational History  . personal trainer    Social History Main Topics  . Smoking status: Never Smoker   . Smokeless tobacco: Never Used  . Alcohol Use: 0.0 oz/week    0 drink(s) per week  . Drug Use: No  . Sexual Activity: Not on file   Other Topics Concern  . Not on file   Social History Narrative  . No narrative on file    Objective:  Filed Vitals:   05/31/13 1105  BP: 114/76  Pulse: 68  Temp: 98.6 F (37 C)  Resp: 16     General appearance: alert, cooperative and appears stated age Ears: normal TM's and external ear canals both ears Throat: lips, mucosa, and tongue normal; teeth and gums normal Neck: no adenopathy, no carotid bruit, supple, symmetrical, trachea midline and thyroid not enlarged, symmetric, no tenderness/mass/nodules Back: symmetric, no curvature. ROM normal. No CVA tenderness. Lungs: clear to auscultation bilaterally Heart: regular rate and rhythm, S1, S2 normal, no murmur, click, rub or gallop Abdomen: soft, non-tender; bowel sounds  normal; no masses,  no organomegaly Pulses: 2+ and symmetric Skin: Skin color, texture, turgor normal. No rashes or lesions Lymph nodes: Cervical, supraclavicular, and axillary nodes normal.  Assessment and Plan:  History of Hashimoto thyroiditis T4 was slightly low last month but repeat today was normal.   Snoring With insomnia, morning tiredness and daytime somnolence.  Sleep study ordered to rule out OSA  Abdominal  bloating Pelvic ultrasound was normal.  However given liver enzyme elevation and thrombocytopenia,  Will need abd ultrasound. To rule out hypersplenism  Thrombocytopenia, unspecified Her  platelets are still dropping. I have ordered  an abdominal ultrasound to evaluate both spleen and liver and will refer to hematology   Depression with anxiety Her symptoms have improved now that she has set boundaries with clients .  She has no desire to continue paxil.  She has prn alprazolam for sleep and extreme emotions. The risks and benefits of benzodiazepine use were discussed with patient today including excessive sedation leading to respiratory depression,  impaired thinking/driving, and addiction.  Patient was advised to avoid concurrent use with alcohol, to use medication only as needed and not to share with others  .      Updated Medication List Outpatient Encounter Prescriptions as of 05/31/2013  Medication Sig  . ALPRAZolam (XANAX) 0.5 MG tablet Take 1 tablet (0.5 mg total) by mouth at bedtime as needed for anxiety.  . Ascorbic Acid (VITAMIN C) 1000 MG tablet Take 1,000 mg by mouth 4 (four) times daily.    Marland Kitchen b complex vitamins tablet Take 1 tablet by mouth 2 (two) times daily.    . cholecalciferol (VITAMIN D) 1000 UNITS tablet Take 1,000 Units by mouth daily.    . Glucosamine-Chondroit-Vit C-Mn (GLUCOSAMINE 1500 COMPLEX PO) Take by mouth 2 (two) times daily.    Marland Kitchen GLUTAMINE PO Take by mouth 3 (three) times daily.    . Multiple Vitamins-Minerals (MULTIVITAMIN PO) Take by mouth daily.    . Omega-3 Fatty Acids (FISH OIL MAXIMUM STRENGTH) 1200 MG CPDR Take by mouth 2 (two) times daily.    Marland Kitchen PARoxetine (PAXIL) 10 MG tablet Take 1 tablet (10 mg total) by mouth daily.  . polyethylene glycol powder (GLYCOLAX/MIRALAX) powder 255 grams one bottle for colonoscopy prep  . Pyridoxine HCl (VITAMIN B-6 PO) Take by mouth 2 (two) times daily.    Marland Kitchen zolpidem (AMBIEN) 10 MG tablet Take 1 tablet (10 mg total) by  mouth at bedtime as needed for sleep.     Orders Placed This Encounter  Procedures  . US Abdomen Complete  . Hepatitis C antibody  . CBC with Differential    No Follow-up on file.

## 2013-06-01 ENCOUNTER — Encounter: Payer: Self-pay | Admitting: Internal Medicine

## 2013-06-01 ENCOUNTER — Ambulatory Visit: Payer: Self-pay | Admitting: Internal Medicine

## 2013-06-01 DIAGNOSIS — D696 Thrombocytopenia, unspecified: Secondary | ICD-10-CM | POA: Insufficient documentation

## 2013-06-01 LAB — ANTI-SMITH ANTIBODY: ENA SM AB SER-ACNC: NEGATIVE

## 2013-06-01 LAB — HEPATITIS B CORE ANTIBODY, TOTAL: Hep B Core Total Ab: NONREACTIVE

## 2013-06-01 LAB — HEPATITIS C ANTIBODY: HCV AB: NEGATIVE

## 2013-06-01 LAB — ANA: ANA: NEGATIVE

## 2013-06-01 LAB — HEPATITIS B SURFACE ANTIGEN: HEP B S AG: NEGATIVE

## 2013-06-01 LAB — HEPATITIS B SURFACE ANTIBODY,QUALITATIVE: Hep B S Ab: NEGATIVE

## 2013-06-01 NOTE — Assessment & Plan Note (Signed)
Her  platelets are still dropping. I have ordered  an abdominal ultrasound to evaluate both spleen and liver and will refer to hematology

## 2013-06-01 NOTE — Assessment & Plan Note (Addendum)
With insomnia, morning tiredness and daytime somnolence.  Sleep study ordered to rule out OSA

## 2013-06-01 NOTE — Assessment & Plan Note (Signed)
T4 was slightly low last month but repeat today was normal.

## 2013-06-01 NOTE — Assessment & Plan Note (Signed)
Her symptoms have improved now that she has set boundaries with clients .  She has no desire to continue paxil.  She has prn alprazolam for sleep and extreme emotions. The risks and benefits of benzodiazepine use were discussed with patient today including excessive sedation leading to respiratory depression,  impaired thinking/driving, and addiction.  Patient was advised to avoid concurrent use with alcohol, to use medication only as needed and not to share with others  .

## 2013-06-01 NOTE — Assessment & Plan Note (Signed)
Pelvic ultrasound was normal.  However given liver enzyme elevation and thrombocytopenia,  Will need abd ultrasound. To rule out hypersplenism

## 2013-06-04 NOTE — Telephone Encounter (Signed)
i have filled out the referral sheet for [facility] and faxed/[id number] The [facility] been [facility] at [facility]./vfw left message to call to confirm

## 2013-06-06 ENCOUNTER — Encounter: Payer: Self-pay | Admitting: Internal Medicine

## 2013-06-06 NOTE — Telephone Encounter (Signed)
No discharge.  She apologized to the office staff

## 2013-06-08 ENCOUNTER — Telehealth: Payer: Self-pay | Admitting: *Deleted

## 2013-06-08 NOTE — Telephone Encounter (Deleted)
Please call patient with unread mychart message

## 2013-06-08 NOTE — Telephone Encounter (Signed)
Please call pt with unread mychart message:  "I hope your sleep study goes well. All of your labs are back and there are no signs of thyroid problems or inflammatory processes affecting your liver. I do believe your liver enzymes may be continually elevated because of your workouts and muscle breakdown .   However, your platelets are still dropping. They're still above 100K so there is no danger of bleeding, But because of this I think it would be wise to get an abdominal ultrasound to evaluate both spleen and liver and have you follow up with a hematologist . I will set these up for you next week. If you do not have a preference, i will refer you to Dr. Alyssa Grove At [facility]. If you would rather see a hematologist at a major center ([facility] or [facility]), That is certainly arrangeable as well . Let me know where you prefer."

## 2013-06-08 NOTE — Telephone Encounter (Signed)
Patient notified and read message

## 2013-06-09 ENCOUNTER — Telehealth: Payer: Self-pay | Admitting: Internal Medicine

## 2013-06-09 NOTE — Telephone Encounter (Signed)
Notified patient to check Email.

## 2013-06-09 NOTE — Telephone Encounter (Signed)
Per documentation on order patient is scheduled for [facility].15 at [facility]. I have left her a message to make sure she is aware of this appointment.

## 2013-06-24 ENCOUNTER — Encounter: Payer: Self-pay | Admitting: Internal Medicine

## 2013-06-28 NOTE — Telephone Encounter (Signed)
Mailed unread message to pt  

## 2013-07-03 ENCOUNTER — Telehealth: Payer: Self-pay

## 2013-07-03 NOTE — Telephone Encounter (Addendum)
The patient had an referral for an abdomin US.  I called the pt to schedule, left a voice mail.  The patient called back - she stated she would not be going to have this procedure done.  I read her the mychart msg word per word, however, she still stated she was not going to have any further testing done.  She stated she did not want any further treatment.   This msg was given to Dr.Tullo and the ref was closed.

## 2013-11-13 ENCOUNTER — Encounter: Payer: Self-pay | Admitting: Internal Medicine

## 2015-03-05 ENCOUNTER — Encounter: Payer: Self-pay | Admitting: Internal Medicine

## 2015-03-05 ENCOUNTER — Other Ambulatory Visit (HOSPITAL_COMMUNITY)
Admission: RE | Admit: 2015-03-05 | Discharge: 2015-03-05 | Disposition: A | Discharge status: Home / Self Care | Payer: BLUE CROSS/BLUE SHIELD | Source: Ambulatory Visit | Attending: Internal Medicine | Admitting: Internal Medicine

## 2015-03-05 ENCOUNTER — Ambulatory Visit (INDEPENDENT_AMBULATORY_CARE_PROVIDER_SITE_OTHER): Payer: BLUE CROSS/BLUE SHIELD | Admitting: Internal Medicine

## 2015-03-05 VITALS — BP 120/72 | HR 59 | Temp 97.6°F | Resp 12 | Ht 63.75 in | Wt 132.6 lb

## 2015-03-05 DIAGNOSIS — D696 Thrombocytopenia, unspecified: Secondary | ICD-10-CM | POA: Diagnosis not present

## 2015-03-05 DIAGNOSIS — Z0000 Encounter for general adult medical examination without abnormal findings: Secondary | ICD-10-CM

## 2015-03-05 DIAGNOSIS — Z124 Encounter for screening for malignant neoplasm of cervix: Secondary | ICD-10-CM

## 2015-03-05 DIAGNOSIS — D235 Other benign neoplasm of skin of trunk: Secondary | ICD-10-CM

## 2015-03-05 DIAGNOSIS — D225 Melanocytic nevi of trunk: Secondary | ICD-10-CM

## 2015-03-05 DIAGNOSIS — E785 Hyperlipidemia, unspecified: Secondary | ICD-10-CM

## 2015-03-05 DIAGNOSIS — Z1151 Encounter for screening for human papillomavirus (HPV): Secondary | ICD-10-CM | POA: Insufficient documentation

## 2015-03-05 DIAGNOSIS — R748 Abnormal levels of other serum enzymes: Secondary | ICD-10-CM | POA: Diagnosis not present

## 2015-03-05 DIAGNOSIS — Z1239 Encounter for other screening for malignant neoplasm of breast: Secondary | ICD-10-CM

## 2015-03-05 DIAGNOSIS — Z01419 Encounter for gynecological examination (general) (routine) without abnormal findings: Secondary | ICD-10-CM | POA: Insufficient documentation

## 2015-03-05 DIAGNOSIS — G4733 Obstructive sleep apnea (adult) (pediatric): Secondary | ICD-10-CM

## 2015-03-05 DIAGNOSIS — Z8639 Personal history of other endocrine, nutritional and metabolic disease: Secondary | ICD-10-CM

## 2015-03-05 LAB — COMPREHENSIVE METABOLIC PANEL
ALBUMIN: 3.9 g/dL (ref 3.5–5.2)
ALK PHOS: 72 U/L (ref 39–117)
ALT: 45 U/L — ABNORMAL HIGH (ref 0–35)
AST: 50 U/L — ABNORMAL HIGH (ref 0–37)
BUN: 38 mg/dL — AB (ref 6–23)
CHLORIDE: 104 meq/L (ref 96–112)
CO2: 31 mEq/L (ref 19–32)
Calcium: 9.2 mg/dL (ref 8.4–10.5)
Creatinine, Ser: 0.64 mg/dL (ref 0.40–1.20)
GFR: 102.51 mL/min (ref >60.00)
Glucose, Bld: 100 mg/dL — ABNORMAL HIGH (ref 70–99)
Potassium: 4.3 mEq/L (ref 3.5–5.1)
SODIUM: 139 meq/L (ref 135–145)
TOTAL PROTEIN: 6.2 g/dL (ref 6.0–8.3)
Total Bilirubin: 0.5 mg/dL (ref 0.2–1.2)

## 2015-03-05 LAB — TSH: TSH: 1.75 u[IU]/mL (ref 0.35–4.50)

## 2015-03-05 LAB — LDL CHOLESTEROL, DIRECT: LDL DIRECT: 68 mg/dL

## 2015-03-05 LAB — CK: CK TOTAL: 321 U/L — AB (ref 7–177)

## 2015-03-05 NOTE — Progress Notes (Signed)
Patient ID: Crystal Erickson, female    DOB: 03/11/1960  Age: 55 y.o. MRN: [id number]  The patient is here for annual  wellness examination and management of other chronic and acute problems.   The risk factors are reflected in the social history.      colonoscopy 2014 Overdue for mammogram Due for PAP   The roster of all physicians providing medical care to patient - is listed in the Snapshot section of the chart.  Activities of daily living:  The patient is 100% independent in all ADLs: dressing, toileting, feeding as well as independent mobility  Home safety : The patient has smoke detectors in the home. They wear seatbelts.  There are no firearms at home. There is no violence in the home.   There is no risks for hepatitis, STDs or HIV. There is no   history of blood transfusion. They have no travel history to infectious disease endemic areas of the world.  The patient has seen their dentist in the last six month. They have seen their eye doctor in the last year. They admit to slight hearing difficulty with regard to whispered voices and some television programs.  They have deferred audiologic testing in the last year.  They do  have excessive sun exposure. Discussed the need for sun protection: hats, long sleeves and use of sunscreen if there is significant sun exposure.   Diet: the importance of a healthy diet is discussed. They do have a healthy diet.  The benefits of regular aerobic exercise were discussed. She works out riegourously 5 times per week  For 60 minutes or more.    Depression screen: there are no signs or vegative symptoms of depression- irritability, change in appetite, anhedonia, sadness/tearfullness.  Cognitive assessment: the patient manages all their financial and personal affairs and is actively engaged. They could relate day,date,year and events; recalled 2/3 objects at 3 minutes; performed clock-face test normally.  The following portions of the patient's history  were reviewed and updated as appropriate: allergies, current medications, past family history, past medical history,  past surgical history, past social history  and problem list.  Visual acuity was not assessed per patient preference since she has regular follow up with her ophthalmologist. Hearing and body mass index were assessed and reviewed.   During the course of the visit the patient was educated and counseled about appropriate screening and preventive services including : fall prevention , diabetes screening, nutrition counseling, colorectal cancer screening, and recommended immunizations.    CC: The primary encounter diagnosis was Cervical cancer screening. 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  History Crystal Erickson has no past medical history on file.   She has no past surgical history on file.   Her family history includes Cancer in her mother; Diabetes in her father; Mental illness (age of onset: 75) in her mother.She reports that she has never smoked. She has never used smokeless tobacco. She reports that she drinks alcohol. She reports that she does not use illicit drugs.  Outpatient Prescriptions Prior to Visit  Medication Sig Dispense Refill  . Ascorbic Acid (VITAMIN C) 1000 MG tablet Take 1,000 mg by mouth 4 (four) times daily.      Marland Kitchen b complex vitamins tablet Take 1 tablet by mouth 2 (two) times daily.      . cholecalciferol (VITAMIN D) 1000  UNITS tablet Take 1,000 Units by mouth daily.      . Glucosamine-Chondroit-Vit C-Mn (GLUCOSAMINE 1500 COMPLEX PO) Take by mouth 2 (two) times daily.      Marland Kitchen GLUTAMINE PO Take by mouth 3 (three) times daily.      . Multiple Vitamins-Minerals (MULTIVITAMIN PO) Take by mouth daily.      . Omega-3 Fatty Acids (FISH OIL MAXIMUM STRENGTH) 1200  MG CPDR Take by mouth 2 (two) times daily.      . Pyridoxine HCl (VITAMIN B-6 PO) Take by mouth 2 (two) times daily.      Marland Kitchen ALPRAZolam (XANAX) 0.5 MG tablet Take 1 tablet (0.5 mg total) by mouth at bedtime as needed for anxiety. (Patient not taking: Reported on 03/05/2015) 30 tablet 3  . PARoxetine (PAXIL) 10 MG tablet Take 1 tablet (10 mg total) by mouth daily. (Patient not taking: Reported on 03/05/2015) 30 tablet 2  . polyethylene glycol powder (GLYCOLAX/MIRALAX) powder 255 grams one bottle for colonoscopy prep 255 g 0  . zolpidem (AMBIEN) 10 MG tablet Take 1 tablet (10 mg total) by mouth at bedtime as needed for sleep. 30 tablet 5   No facility-administered medications prior to visit.    Review of Systems   Patient denies headache, fevers, malaise, unintentional weight loss, skin rash, eye pain, sinus congestion and sinus pain, sore throat, dysphagia,  hemoptysis , cough, dyspnea, wheezing, chest pain, palpitations, orthopnea, edema, abdominal pain, nausea, melena, diarrhea, constipation, flank pain, dysuria, hematuria, urinary  Frequency, nocturia, numbness, tingling, seizures,  Focal weakness, Loss of consciousness,  Tremor, insomnia, depression, anxiety, and suicidal ideation.     Objective:  BP 120/72 mmHg  Pulse 59  Temp(Src) 97.6 F (36.4 C) (Oral)  Resp 12  Ht 5' 3.75" (1.619 m)  Wt 132 lb 10 oz (60.158 kg)  BMI 22.95 kg/m2  SpO2 99%  LMP 08/13/2010  Physical Exam  General Appearance:    Alert, cooperative, no distress, appears stated age  Head:    Normocephalic, without obvious abnormality, atraumatic  Eyes:    PERRL, conjunctiva/corneas clear, EOM's intact, fundi    benign, both eyes  Ears:    Normal TM's and external ear canals, both ears  Nose:   Nares normal, septum midline, mucosa normal, no drainage    or sinus tenderness  Throat:   Lips, mucosa, and tongue normal; teeth and gums normal  Neck:   Supple, symmetrical, trachea midline, no adenopathy;    thyroid:  no  enlargement/tenderness/nodules; no carotid   bruit or JVD  Back:     Symmetric, no curvature, ROM normal, no CVA tenderness  Lungs:     Clear to auscultation bilaterally, respirations unlabored  Chest Wall:    No tenderness or deformity   Heart:    Regular rate and rhythm, S1 and S2 normal, no murmur, rub   or gallop  Breast Exam:    No tenderness, masses, or nipple abnormality  Abdomen:     Soft, non-tender, bowel sounds active all four quadrants,    no masses, no organomegaly  Genitalia:    Pelvic: cervix normal in appearance, external genitalia normal, no adnexal masses or tenderness, no cervical motion tenderness, rectovaginal septum normal, uterus normal size, shape, and consistency and vagina normal without discharge  Extremities:   Extremities normal, atraumatic, no cyanosis or edema  Pulses:   2+ and symmetric all extremities  Skin:   Tanned,  Sun damage noted,  Stellate shaped nevus on chest wall  Lymph nodes:  Cervical, supraclavicular, and axillary nodes normal  Neurologic:   CNII-XII intact, normal strength, sensation and reflexes    throughout    Assessment & Plan:   Problem List Items Addressed This Visit    History of Hashimoto thyroiditis    Annual thyroid screen is normal  Lab Results  Component Value Date   TSH 1.75 03/05/2015          Elevated liver enzymes    Persistent , with negative serologies for autoimmune causes. Presumed to be secondary to muscle breakdown given her activity level and elevated CK. 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 Olveda's polyethylene glycol powder, PARoxetine, ALPRAZolam, and zolpidem. I am also having her maintain her Multiple Vitamins-Minerals (MULTIVITAMIN PO), FISH OIL MAXIMUM STRENGTH, b complex vitamins, Pyridoxine HCl (VITAMIN B-6 PO), vitamin C, cholecalciferol, GLUTAMINE PO, and Glucosamine-Chondroit-Vit C-Mn (GLUCOSAMINE 1500 COMPLEX PO).  No orders of the defined types were placed in this encounter.    Medications Discontinued During This Encounter  Medication Reason  . polyethylene glycol powder (GLYCOLAX/MIRALAX) powder Error  . ALPRAZolam (XANAX) 0.5 MG tablet   . PARoxetine (PAXIL) 10 MG tablet   . zolpidem (AMBIEN) 10 MG tablet     Follow-up: No Follow-up on file.   Crecencio Mc, MD

## 2015-03-05 NOTE — Patient Instructions (Signed)
Referral to Crystal Erickson (Dr Kellie Moor) is in process Mammogram also ordered  The  diet I discussed with you today is the 10 day Green Smoothie Cleansing /Detox Diet by Linden Dolin . available on [place] for around $10.  Menopause is a normal process in which your reproductive ability comes to an end. This process happens gradually over a span of months to years, usually between the ages of 13 and 45. Menopause is complete when you have missed 12 consecutive menstrual periods. It is important to talk with your health care provider about some of the most common conditions that affect postmenopausal women, such as heart disease, cancer, and bone loss (osteoporosis). Adopting a healthy lifestyle and getting preventive care can help to promote your health and wellness. Those actions can also lower your chances of developing some of these common conditions. WHAT SHOULD I KNOW ABOUT MENOPAUSE? During menopause, you may experience a number of symptoms, such as:  Moderate-to-severe hot flashes.  Night sweats.  Decrease in sex drive.  Mood swings.  Headaches.  Tiredness.  Irritability.  Memory problems.  Insomnia. Choosing to treat or not to treat menopausal changes is an individual decision that you make with your health care provider. WHAT SHOULD I KNOW ABOUT HORMONE REPLACEMENT THERAPY AND SUPPLEMENTS? Hormone therapy products are effective for treating symptoms that are associated with menopause, such as hot flashes and night sweats. Hormone replacement carries certain risks, especially as you become older. If you are thinking about using estrogen or estrogen with progestin treatments, discuss the benefits and risks with your health care provider. WHAT SHOULD I KNOW ABOUT HEART DISEASE AND STROKE? Heart disease, heart attack, and stroke become more likely as you age. This may be due, in part, to the hormonal changes that your body experiences during menopause. These can affect how your  body processes dietary fats, triglycerides, and cholesterol. Heart attack and stroke are both medical emergencies. There are many things that you can do to help prevent heart disease and stroke:  Have your blood pressure checked at least every 1-2 years. High blood pressure causes heart disease and increases the risk of stroke.  If you are 26-38 years old, ask your health care provider if you should take aspirin to prevent a heart attack or a stroke.  Do not use any tobacco products, including cigarettes, chewing tobacco, or electronic cigarettes. If you need help quitting, ask your health care provider.  It is important to eat a healthy diet and maintain a healthy weight.  Be sure to include plenty of vegetables, fruits, low-fat dairy products, and lean protein.  Avoid eating foods that are high in solid fats, added sugars, or salt (sodium).  Get regular exercise. This is one of the most important things that you can do for your health.  Try to exercise for at least 150 minutes each week. The type of exercise that you do should increase your heart rate and make you sweat. This is known as moderate-intensity exercise.  Try to do strengthening exercises at least twice each week. Do these in addition to the moderate-intensity exercise.  Know your numbers.Ask your health care provider to check your cholesterol and your blood glucose. Continue to have your blood tested as directed by your health care provider. WHAT SHOULD I KNOW ABOUT CANCER SCREENING? There are several types of cancer. Take the following steps to reduce your risk and to catch any cancer development as early as possible. Breast Cancer  Practice breast self-awareness.  This means  understanding how your breasts normally appear and feel.  It also means doing regular breast self-exams. Let your health care provider know about any changes, no matter how small.  If you are 55 or older, have a clinician do a breast exam  (clinical breast exam or CBE) every year. Depending on your age, family history, and medical history, it may be recommended that you also have a yearly breast X-ray (mammogram).  If you have a family history of breast cancer, talk with your health care provider about genetic screening.  If you are at high risk for breast cancer, talk with your health care provider about having an MRI and a mammogram every year.  Breast cancer (BRCA) gene test is recommended for women who have family members with BRCA-related cancers. Results of the assessment will determine the need for genetic counseling and BRCA1 and for BRCA2 testing. BRCA-related cancers include these types:  Breast. This occurs in males or females.  Ovarian.  Tubal. This may also be called fallopian tube cancer.  Cancer of the abdominal or pelvic lining (peritoneal cancer).  Prostate.  Pancreatic. Cervical, Uterine, and Ovarian Cancer Your health care provider may recommend that you be screened regularly for cancer of the pelvic organs. These include your ovaries, uterus, and vagina. This screening involves a pelvic exam, which includes checking for microscopic changes to the surface of your cervix (Pap test).  For women ages 21-65, health care providers may recommend a pelvic exam and a Pap test every three years. For women ages 7-65, they may recommend the Pap test and pelvic exam, combined with testing for human papilloma virus (HPV), every five years. Some types of HPV increase your risk of cervical cancer. Testing for HPV may also be done on women of any age who have unclear Pap test results.  Other health care providers may not recommend any screening for nonpregnant women who are considered low risk for pelvic cancer and have no symptoms. Ask your health care provider if a screening pelvic exam is right for you.  If you have had past treatment for cervical cancer or a condition that could lead to cancer, you need Pap tests and  screening for cancer for at least 20 years after your treatment. If Pap tests have been discontinued for you, your risk factors (such as having a new sexual partner) need to be reassessed to determine if you should start having screenings again. Some women have medical problems that increase the chance of getting cervical cancer. In these cases, your health care provider may recommend that you have screening and Pap tests more often.  If you have a family history of uterine cancer or ovarian cancer, talk with your health care provider about genetic screening.  If you have vaginal bleeding after reaching menopause, tell your health care provider.  There are currently no reliable tests available to screen for ovarian cancer. Lung Cancer Lung cancer screening is recommended for adults 38-33 years old who are at high risk for lung cancer because of a history of smoking. A yearly low-dose CT scan of the lungs is recommended if you:  Currently smoke.  Have a history of at least 30 pack-years of smoking and you currently smoke or have quit within the past 15 years. A pack-year is smoking an average of one pack of cigarettes per day for one year. Yearly screening should:  Continue until it has been 15 years since you quit.  Stop if you develop a health problem that would  prevent you from having lung cancer treatment. Colorectal Cancer  This type of cancer can be detected and can often be prevented.  Routine colorectal cancer screening usually begins at age 30 and continues through age 47.  If you have risk factors for colon cancer, your health care provider may recommend that you be screened at an earlier age.  If you have a family history of colorectal cancer, talk with your health care provider about genetic screening.  Your health care provider may also recommend using home test kits to check for hidden blood in your stool.  A small camera at the end of a tube can be used to examine your  colon directly (sigmoidoscopy or colonoscopy). This is done to check for the earliest forms of colorectal cancer.  Direct examination of the colon should be repeated every 5-10 years until age 64. However, if early forms of precancerous polyps or small growths are found or if you have a family history or genetic risk for colorectal cancer, you may need to be screened more often. Skin Cancer  Check your skin from head to toe regularly.  Monitor any moles. Be sure to tell your health care provider:  About any new moles or changes in moles, especially if there is a change in a mole's shape or color.  If you have a mole that is larger than the size of a pencil eraser.  If any of your family members has a history of skin cancer, especially at a young age, talk with your health care provider about genetic screening.  Always use sunscreen. Apply sunscreen liberally and repeatedly throughout the day.  Whenever you are outside, protect yourself by wearing long sleeves, pants, a wide-brimmed hat, and sunglasses. WHAT SHOULD I KNOW ABOUT OSTEOPOROSIS? Osteoporosis is a condition in which bone destruction happens more quickly than new bone creation. After menopause, you may be at an increased risk for osteoporosis. To help prevent osteoporosis or the bone fractures that can happen because of osteoporosis, the following is recommended:  If you are 92-68 years old, get at least 1,000 mg of calcium and at least 600 mg of vitamin D per day.  If you are older than age 13 but younger than age 52, get at least 1,200 mg of calcium and at least 600 mg of vitamin D per day.  If you are older than age 98, get at least 1,200 mg of calcium and at least 800 mg of vitamin D per day. Smoking and excessive alcohol intake increase the risk of osteoporosis. Eat foods that are rich in calcium and vitamin D, and do weight-bearing exercises several times each week as directed by your health care provider. WHAT SHOULD I  KNOW ABOUT HOW MENOPAUSE AFFECTS [place]? Depression may occur at any age, but it is more common as you become older. Common symptoms of depression include:  Low or sad mood.  Changes in sleep patterns.  Changes in appetite or eating patterns.  Feeling an overall lack of motivation or enjoyment of activities that you previously enjoyed.  Frequent crying spells. Talk with your health care provider if you think that you are experiencing depression. WHAT SHOULD I KNOW ABOUT IMMUNIZATIONS? It is important that you get and maintain your immunizations. These include:  Tetanus, diphtheria, and pertussis (Tdap) booster vaccine.  Influenza every year before the flu season begins.  Pneumonia vaccine.  Shingles vaccine. Your health care provider may also recommend other immunizations.   This information is not intended  to replace advice given to you by your health care provider. Make sure you discuss any questions you have with your health care provider.   Document Released: 02/20/2005 Document Revised: 01/19/2014 Document Reviewed: 08/31/2013 Elsevier Interactive Patient Education [employer].

## 2015-03-05 NOTE — Progress Notes (Signed)
Pre-visit discussion using our clinic review tool. No additional management support is needed unless otherwise documented below in the visit note.  

## 2015-03-06 ENCOUNTER — Encounter: Payer: Self-pay | Admitting: Internal Medicine

## 2015-03-06 DIAGNOSIS — D225 Melanocytic nevi of trunk: Secondary | ICD-10-CM | POA: Insufficient documentation

## 2015-03-06 DIAGNOSIS — G4733 Obstructive sleep apnea (adult) (pediatric): Secondary | ICD-10-CM | POA: Insufficient documentation

## 2015-03-06 LAB — IRON AND TIBC
%SAT: 28 % (ref 11–50)
IRON: 86 ug/dL (ref 45–160)
TIBC: 311 ug/dL (ref 250–450)
UIBC: 225 ug/dL (ref 125–400)

## 2015-03-06 NOTE — Assessment & Plan Note (Signed)
Persistent , with negative serologies for autoimmune causes. Presumed to be secondary to muscle breakdown given her activity level and elevated CK. Ultrasound ordered/  Lab Results  Component Value Date   ALT 45* 03/05/2015   AST 50* 03/05/2015   ALKPHOS 72 03/05/2015   BILITOT 0.5 03/05/2015   Lab Results  Component Value Date   CKTOTAL 321* 03/05/2015   Lab Results  Component Value Date   TSH 1.75 03/05/2015

## 2015-03-06 NOTE — Assessment & Plan Note (Signed)
Referral to dermatology for evaluation of suspicious mole on chest wall

## 2015-03-06 NOTE — Assessment & Plan Note (Signed)
Annual thyroid screen is normal  Lab Results  Component Value Date   TSH 1.75 03/05/2015

## 2015-03-06 NOTE — Assessment & Plan Note (Signed)
Mild, by sleep study in 2014

## 2015-03-07 ENCOUNTER — Encounter: Payer: Self-pay | Admitting: Internal Medicine

## 2015-03-07 LAB — CYTOLOGY - PAP

## 2015-03-08 ENCOUNTER — Encounter: Payer: Self-pay | Admitting: Internal Medicine

## 2015-03-11 ENCOUNTER — Ambulatory Visit: Payer: BLUE CROSS/BLUE SHIELD

## 2015-03-29 ENCOUNTER — Other Ambulatory Visit: Payer: Self-pay | Admitting: Internal Medicine

## 2015-03-29 ENCOUNTER — Ambulatory Visit
Admission: RE | Admit: 2015-03-29 | Discharge: 2015-03-29 | Disposition: A | Discharge status: Home / Self Care | Payer: BLUE CROSS/BLUE SHIELD | Source: Ambulatory Visit | Attending: Internal Medicine | Admitting: Internal Medicine

## 2015-03-29 DIAGNOSIS — Z1231 Encounter for screening mammogram for malignant neoplasm of breast: Secondary | ICD-10-CM

## 2015-03-29 DIAGNOSIS — Z1239 Encounter for other screening for malignant neoplasm of breast: Secondary | ICD-10-CM

## 2015-09-17 ENCOUNTER — Ambulatory Visit: Payer: Self-pay | Admitting: Physician Assistant

## 2015-09-17 ENCOUNTER — Encounter: Payer: Self-pay | Admitting: Physician Assistant

## 2015-09-17 VITALS — Temp 100.6°F

## 2015-09-17 DIAGNOSIS — J069 Acute upper respiratory infection, unspecified: Secondary | ICD-10-CM

## 2015-09-17 DIAGNOSIS — R509 Fever, unspecified: Secondary | ICD-10-CM

## 2015-09-17 LAB — POCT RAPID STREP A (OFFICE): Rapid Strep A Screen: NEGATIVE

## 2015-09-17 MED ORDER — CEFDINIR 300 MG PO CAPS: 300.0000 mg | ORAL_CAPSULE | Freq: Two times a day (BID) | ORAL | 0 refills | Status: DC

## 2015-09-17 NOTE — Progress Notes (Signed)
S: C/o sore throat and  Congestion with wet cough for 3 days, +fever, chills, no cp/sob, v/d; , cough is sporadic,   Using otc meds:  O: PE: vitals w elevated temp, perrl eomi, normocephalic, tms dull, nasal mucosa red and swollen, throat injected, neck supple no lymph, lungs c t a, cv rrr, neuro intact, q strep neg  A:  Acute uri   P: drink fluids, continue regular meds , use otc meds of choice, return if not improving in 5 days, return earlier if worsening , omnicef 300mg  bid

## 2016-03-24 ENCOUNTER — Other Ambulatory Visit: Payer: Self-pay | Admitting: Internal Medicine

## 2016-05-05 ENCOUNTER — Encounter: Payer: Self-pay | Admitting: Internal Medicine

## 2016-05-05 ENCOUNTER — Ambulatory Visit (INDEPENDENT_AMBULATORY_CARE_PROVIDER_SITE_OTHER): Payer: BLUE CROSS/BLUE SHIELD | Admitting: Internal Medicine

## 2016-05-05 VITALS — BP 108/66 | HR 64 | Temp 98.1°F | Resp 16 | Ht 63.5 in | Wt 134.0 lb

## 2016-05-05 DIAGNOSIS — Z1239 Encounter for other screening for malignant neoplasm of breast: Secondary | ICD-10-CM

## 2016-05-05 DIAGNOSIS — Z0000 Encounter for general adult medical examination without abnormal findings: Secondary | ICD-10-CM

## 2016-05-05 DIAGNOSIS — D696 Thrombocytopenia, unspecified: Secondary | ICD-10-CM | POA: Diagnosis not present

## 2016-05-05 DIAGNOSIS — Z8639 Personal history of other endocrine, nutritional and metabolic disease: Secondary | ICD-10-CM | POA: Diagnosis not present

## 2016-05-05 DIAGNOSIS — E782 Mixed hyperlipidemia: Secondary | ICD-10-CM

## 2016-05-05 DIAGNOSIS — R5383 Other fatigue: Secondary | ICD-10-CM

## 2016-05-05 DIAGNOSIS — Z1231 Encounter for screening mammogram for malignant neoplasm of breast: Secondary | ICD-10-CM | POA: Diagnosis not present

## 2016-05-05 DIAGNOSIS — R748 Abnormal levels of other serum enzymes: Secondary | ICD-10-CM

## 2016-05-05 NOTE — Progress Notes (Signed)
Patient ID: Crystal Erickson, female    DOB: 1960-08-08  Age: 56 y.o. MRN: [id number]  The patient is here for annual preventive  examination and management of other chronic and acute problems.   The risk factors are reflected in the social history.  The roster of all physicians providing medical care to patient - is listed in the Snapshot section of the chart.  Activities of daily living:  The patient is 100% independent in all ADLs: dressing, toileting, feeding as well as independent mobility  Home safety : The patient has smoke detectors in the home. They wear seatbelts.  There are no firearms at home. There is no violence in the home.   There is no risks for hepatitis, STDs or HIV. There is no   history of blood transfusion. They have no travel history to infectious disease endemic areas of the world.  The patient has seen their dentist in the last six month. They have seen their eye doctor in the last year.  Discussed the need for sun protection: hats, long sleeves and use of sunscreen if there is significant sun exposure.   Diet: the importance of a healthy diet is discussed. They do have a healthy diet.  The benefits of regular aerobic exercise were discussed. 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  Depression screen: there are no signs or vegative symptoms of depression- irritability, change in appetite, anhedonia, sadness/tearfullness.  Cognitive assessment: the patient manages all their financial and personal affairs and is actively engaged. They could relate day,date,year and events; recalled 2/3 objects at 3 minutes; performed clock-face test normally.  The following portions of the patient's history were reviewed and updated as appropriate: allergies, current medications, past family history, past medical history,  past surgical history, past social history  and problem list.  Visual acuity was not assessed per patient preference since she has regular follow  up with her ophthalmologist. Hearing and body mass index were assessed and reviewed.   During the course of the visit the patient was educated and counseled about appropriate screening and preventive services including : fall prevention , diabetes screening, nutrition counseling, colorectal cancer screening, and recommended immunizations.    CC: The primary encounter diagnosis was Breast cancer screening. Diagnoses of Fatigue, unspecified type, Elevated liver enzymes, Mixed hyperlipidemia, Thrombocytopenia, unspecified ([place]), Encounter for preventive health examination, and History of Hashimoto thyroiditis were also pertinent to this visit.  Taking zendocrine essential oils for the last 2 months to address elevated liver enzymes   Takes a vitamin D 1000 ius daily  History Crystal Erickson has no past medical history on file.   She has no past surgical history on file.   Her family history includes Breast cancer in her maternal aunt; Cancer in her mother; Diabetes in her father; Mental illness (age of onset: 74) in her mother.She reports that she has never smoked. She has never used smokeless tobacco. She reports that she drinks alcohol. She reports that she does not use drugs.  Outpatient Medications Prior to Visit  Medication Sig Dispense Refill  . Ascorbic Acid (VITAMIN C) 1000 MG tablet Take 1,000 mg by mouth 4 (four) times daily.      Marland Kitchen b complex vitamins tablet Take 1 tablet by mouth 2 (two) times daily.      . cholecalciferol (VITAMIN D) 1000 UNITS tablet Take 1,000 Units by mouth daily.      . Glucosamine-Chondroit-Vit C-Mn (GLUCOSAMINE 1500 COMPLEX PO) Take by mouth 2 (two)  times daily.      Marland Kitchen GLUTAMINE PO Take by mouth 3 (three) times daily.      . Multiple Vitamins-Minerals (MULTIVITAMIN PO) Take by mouth daily.      . Omega-3 Fatty Acids (FISH OIL MAXIMUM STRENGTH) 1200 MG CPDR Take by mouth 2 (two) times daily.      . cefdinir (OMNICEF) 300 MG capsule Take 1 capsule (300 mg total) by mouth 2  (two) times daily. (Patient not taking: Reported on 05/05/2016) 20 capsule 0   No facility-administered medications prior to visit.     Review of Systems   Patient denies headache, fevers, malaise, unintentional weight loss, skin rash, eye pain, sinus congestion and sinus pain, sore throat, dysphagia,  hemoptysis , cough, dyspnea, wheezing, chest pain, palpitations, orthopnea, edema, abdominal pain, nausea, melena, diarrhea, constipation, flank pain, dysuria, hematuria, urinary  Frequency, nocturia, numbness, tingling, seizures,  Focal weakness, Loss of consciousness,  Tremor, insomnia, depression, anxiety, and suicidal ideation.     Objective:  BP 108/66   Pulse 64   Temp 98.1 F (36.7 C) (Oral)   Resp 16   Ht 5' 3.5" (1.613 m)   Wt 134 lb (60.8 kg)   LMP 08/13/2010   SpO2 98%   BMI 23.36 kg/m   Physical Exam   General appearance: alert, cooperative and appears stated age Head: Normocephalic, without obvious abnormality, atraumatic Eyes: conjunctivae/corneas clear. PERRL, EOM's intact. Fundi benign. Ears: normal TM's and external ear canals both ears Nose: Nares normal. Septum midline. Mucosa normal. No drainage or sinus tenderness. Throat: lips, mucosa, and tongue normal; teeth and gums normal Neck: no adenopathy, no carotid bruit, no JVD, supple, symmetrical, trachea midline and thyroid not enlarged, symmetric, no tenderness/mass/nodules Lungs: clear to auscultation bilaterally Breasts: normal appearance, no masses or tenderness Heart: regular rate and rhythm, S1, S2 normal, no murmur, click, rub or gallop Abdomen: soft, non-tender; bowel sounds normal; no masses,  no organomegaly Extremities: extremities normal, atraumatic, no cyanosis or edema Pulses: 2+ and symmetric Skin: Skin color, texture, turgor normal. No rashes or lesions Neurologic: Alert and oriented X 3, normal strength and tone. Normal symmetric reflexes. Normal coordination and gait.      Assessment & Plan:    Problem List Items Addressed This Visit    Elevated liver enzymes    Presumed secondary to muscle enzyme elevation for rigourous workouts.  She has continually deferred ultrasound. Prior serologic workup for autoimmune causes was negative.   Lab Results  Component Value Date   ALT 45 (H) 05/05/2016   AST 48 (H) 05/05/2016   ALKPHOS 91 05/05/2016   BILITOT 0.4 05/05/2016   Lab Results  Component Value Date   CKTOTAL 306 (H) 05/05/2016         Relevant Orders   VITAMIN D 25 Hydroxy (Vit-D Deficiency, Fractures) (Completed)   CK (Creatine Kinase) (Completed)   Encounter for preventive health examination    Annual comprehensive preventive exam was done as well as an evaluation and management of chronic conditions .  During the course of the visit the patient was educated and counseled about appropriate screening and preventive services including :  diabetes screening, lipid analysis with projected  10 year  risk for CAD , nutrition counseling, breast, cervical and colorectal cancer screening, and recommended immunizations.  Printed recommendations for health maintenance screenings was given.  Lab Results  Component Value Date   CHOL 188 05/05/2016   HDL 93.80 05/05/2016   LDLCALC 81 05/05/2016   LDLDIRECT 80.0 05/05/2016  TRIG 67.0 05/05/2016   CHOLHDL 2 05/05/2016         History of Hashimoto thyroiditis    Annual thyroid screen is normal  Lab Results  Component Value Date   TSH 1.82 05/05/2016          Thrombocytopenia, unspecified ([facility])    Persistent, mild, unchanged.   Lab Results  Component Value Date   WBC 7.7 05/05/2016   HGB 13.5 05/05/2016   HCT 39.9 05/05/2016   MCV 94.3 05/05/2016   PLT 134.0 (L) 05/05/2016          Other Visit Diagnoses    Breast cancer screening    -  Primary   Relevant Orders   MM SCREENING BREAST TOMO BILATERAL   Fatigue, unspecified type       Relevant Orders   Comprehensive metabolic panel (Completed)   TSH  (Completed)   CBC with Differential (Completed)   Mixed hyperlipidemia       Relevant Orders   Lipid panel (Completed)   LDL cholesterol, direct (Completed)      I have discontinued Ms. Krouse's cefdinir. I am also having her maintain her Multiple Vitamins-Minerals (MULTIVITAMIN PO), FISH OIL MAXIMUM STRENGTH, b complex vitamins, vitamin C, cholecalciferol, GLUTAMINE PO, and Glucosamine-Chondroit-Vit C-Mn (GLUCOSAMINE 1500 COMPLEX PO).  No orders of the defined types were placed in this encounter.   Medications Discontinued During This Encounter  Medication Reason  . cefdinir (OMNICEF) 300 MG capsule Therapy completed    Follow-up: No Follow-up on file.   Crecencio Mc, MD

## 2016-05-05 NOTE — Progress Notes (Signed)
Pre visit review using our clinic review tool, if applicable. No additional management support is needed unless otherwise documented below in the visit note. 

## 2016-05-05 NOTE — Patient Instructions (Signed)
Health Maintenance for Postmenopausal Women Menopause is a normal process in which your reproductive ability comes to an end. This process happens gradually over a span of months to years, usually between the ages of 33 and 38. Menopause is complete when you have missed 12 consecutive menstrual periods. It is important to talk with your health care provider about some of the most common conditions that affect postmenopausal women, such as heart disease, cancer, and bone loss (osteoporosis). Adopting a healthy lifestyle and getting preventive care can help to promote your health and wellness. Those actions can also lower your chances of developing some of these common conditions. What should I know about menopause? During menopause, you may experience a number of symptoms, such as:  Moderate-to-severe hot flashes.  Night sweats.  Decrease in sex drive.  Mood swings.  Headaches.  Tiredness.  Irritability.  Memory problems.  Insomnia. Choosing to treat or not to treat menopausal changes is an individual decision that you make with your health care provider. What should I know about hormone replacement therapy and supplements? Hormone therapy products are effective for treating symptoms that are associated with menopause, such as hot flashes and night sweats. Hormone replacement carries certain risks, especially as you become older. If you are thinking about using estrogen or estrogen with progestin treatments, discuss the benefits and risks with your health care provider. What should I know about heart disease and stroke? Heart disease, heart attack, and stroke become more likely as you age. This may be due, in part, to the hormonal changes that your body experiences during menopause. These can affect how your body processes dietary fats, triglycerides, and cholesterol. Heart attack and stroke are both medical emergencies. There are many things that you can do to help prevent heart disease  and stroke:  Have your blood pressure checked at least every 1-2 years. High blood pressure causes heart disease and increases the risk of stroke.  If you are 48-61 years old, ask your health care provider if you should take aspirin to prevent a heart attack or a stroke.  Do not use any tobacco products, including cigarettes, chewing tobacco, or electronic cigarettes. If you need help quitting, ask your health care provider.  It is important to eat a healthy diet and maintain a healthy weight.  Be sure to include plenty of vegetables, fruits, low-fat dairy products, and lean protein.  Avoid eating foods that are high in solid fats, added sugars, or salt (sodium).  Get regular exercise. This is one of the most important things that you can do for your health.  Try to exercise for at least 150 minutes each week. The type of exercise that you do should increase your heart rate and make you sweat. This is known as moderate-intensity exercise.  Try to do strengthening exercises at least twice each week. Do these in addition to the moderate-intensity exercise.  Know your numbers.Ask your health care provider to check your cholesterol and your blood glucose. Continue to have your blood tested as directed by your health care provider. What should I know about cancer screening? There are several types of cancer. Take the following steps to reduce your risk and to catch any cancer development as early as possible. Breast Cancer  Practice breast self-awareness.  This means understanding how your breasts normally appear and feel.  It also means doing regular breast self-exams. Let your health care provider know about any changes, no matter how small.  If you are 40 or older,  have a clinician do a breast exam (clinical breast exam or CBE) every year. Depending on your age, family history, and medical history, it may be recommended that you also have a yearly breast X-ray (mammogram).  If you  have a family history of breast cancer, talk with your health care provider about genetic screening.  If you are at high risk for breast cancer, talk with your health care provider about having an MRI and a mammogram every year.  Breast cancer (BRCA) gene test is recommended for women who have family members with BRCA-related cancers. Results of the assessment will determine the need for genetic counseling and BRCA1 and for BRCA2 testing. BRCA-related cancers include these types:  Breast. This occurs in males or females.  Ovarian.  Tubal. This may also be called fallopian tube cancer.  Cancer of the abdominal or pelvic lining (peritoneal cancer).  Prostate.  Pancreatic. Cervical, Uterine, and Ovarian Cancer  Your health care provider may recommend that you be screened regularly for cancer of the pelvic organs. These include your ovaries, uterus, and vagina. This screening involves a pelvic exam, which includes checking for microscopic changes to the surface of your cervix (Pap test).  For women ages 21-65, health care providers may recommend a pelvic exam and a Pap test every three years. For women ages 23-65, they may recommend the Pap test and pelvic exam, combined with testing for human papilloma virus (HPV), every five years. Some types of HPV increase your risk of cervical cancer. Testing for HPV may also be done on women of any age who have unclear Pap test results.  Other health care providers may not recommend any screening for nonpregnant women who are considered low risk for pelvic cancer and have no symptoms. Ask your health care provider if a screening pelvic exam is right for you.  If you have had past treatment for cervical cancer or a condition that could lead to cancer, you need Pap tests and screening for cancer for at least 20 years after your treatment. If Pap tests have been discontinued for you, your risk factors (such as having a new sexual partner) need to be reassessed  to determine if you should start having screenings again. Some women have medical problems that increase the chance of getting cervical cancer. In these cases, your health care provider may recommend that you have screening and Pap tests more often.  If you have a family history of uterine cancer or ovarian cancer, talk with your health care provider about genetic screening.  If you have vaginal bleeding after reaching menopause, tell your health care provider.  There are currently no reliable tests available to screen for ovarian cancer. Lung Cancer  Lung cancer screening is recommended for adults 99-83 years old who are at high risk for lung cancer because of a history of smoking. A yearly low-dose CT scan of the lungs is recommended if you:  Currently smoke.  Have a history of at least 30 pack-years of smoking and you currently smoke or have quit within the past 15 years. A pack-year is smoking an average of one pack of cigarettes per day for one year. Yearly screening should:  Continue until it has been 15 years since you quit.  Stop if you develop a health problem that would prevent you from having lung cancer treatment. Colorectal Cancer  This type of cancer can be detected and can often be prevented.  Routine colorectal cancer screening usually begins at age 72 and continues  through age 75.  If you have risk factors for colon cancer, your health care provider may recommend that you be screened at an earlier age.  If you have a family history of colorectal cancer, talk with your health care provider about genetic screening.  Your health care provider may also recommend using home test kits to check for hidden blood in your stool.  A small camera at the end of a tube can be used to examine your colon directly (sigmoidoscopy or colonoscopy). This is done to check for the earliest forms of colorectal cancer.  Direct examination of the colon should be repeated every 5-10 years until  age 75. However, if early forms of precancerous polyps or small growths are found or if you have a family history or genetic risk for colorectal cancer, you may need to be screened more often. Skin Cancer  Check your skin from head to toe regularly.  Monitor any moles. Be sure to tell your health care provider:  About any new moles or changes in moles, especially if there is a change in a mole's shape or color.  If you have a mole that is larger than the size of a pencil eraser.  If any of your family members has a history of skin cancer, especially at a young age, talk with your health care provider about genetic screening.  Always use sunscreen. Apply sunscreen liberally and repeatedly throughout the day.  Whenever you are outside, protect yourself by wearing long sleeves, pants, a wide-brimmed hat, and sunglasses. What should I know about osteoporosis? Osteoporosis is a condition in which bone destruction happens more quickly than new bone creation. After menopause, you may be at an increased risk for osteoporosis. To help prevent osteoporosis or the bone fractures that can happen because of osteoporosis, the following is recommended:  If you are 19-50 years old, get at least 1,000 mg of calcium and at least 600 mg of vitamin D per day.  If you are older than age 50 but younger than age 70, get at least 1,200 mg of calcium and at least 600 mg of vitamin D per day.  If you are older than age 70, get at least 1,200 mg of calcium and at least 800 mg of vitamin D per day. Smoking and excessive alcohol intake increase the risk of osteoporosis. Eat foods that are rich in calcium and vitamin D, and do weight-bearing exercises several times each week as directed by your health care provider. What should I know about how menopause affects my mental health? Depression may occur at any age, but it is more common as you become older. Common symptoms of depression include:  Low or sad  mood.  Changes in sleep patterns.  Changes in appetite or eating patterns.  Feeling an overall lack of motivation or enjoyment of activities that you previously enjoyed.  Frequent crying spells. Talk with your health care provider if you think that you are experiencing depression. What should I know about immunizations? It is important that you get and maintain your immunizations. These include:  Tetanus, diphtheria, and pertussis (Tdap) booster vaccine.  Influenza every year before the flu season begins.  Pneumonia vaccine.  Shingles vaccine. Your health care provider may also recommend other immunizations. This information is not intended to replace advice given to you by your health care provider. Make sure you discuss any questions you have with your health care provider. Document Released: 02/20/2005 Document Revised: 07/19/2015 Document Reviewed: 10/02/2014 Elsevier Interactive Patient   Education  2017 [employer]  

## 2016-05-06 LAB — COMPREHENSIVE METABOLIC PANEL
ALT: 45 U/L — ABNORMAL HIGH (ref 0–35)
AST: 48 U/L — AB (ref 0–37)
Albumin: 4 g/dL (ref 3.5–5.2)
Alkaline Phosphatase: 91 U/L (ref 39–117)
BUN: 43 mg/dL — ABNORMAL HIGH (ref 6–23)
CALCIUM: 9.3 mg/dL (ref 8.4–10.5)
CHLORIDE: 101 meq/L (ref 96–112)
CO2: 32 meq/L (ref 19–32)
CREATININE: 1.2 mg/dL (ref 0.40–1.20)
GFR: 49.41 mL/min — ABNORMAL LOW (ref >60.00)
GLUCOSE: 75 mg/dL (ref 70–99)
Potassium: 4 mEq/L (ref 3.5–5.1)
SODIUM: 140 meq/L (ref 135–145)
Total Bilirubin: 0.4 mg/dL (ref 0.2–1.2)
Total Protein: 6.3 g/dL (ref 6.0–8.3)

## 2016-05-06 LAB — CBC WITH DIFFERENTIAL/PLATELET
BASOS PCT: 1.7 % (ref 0.0–3.0)
Basophils Absolute: 0.1 10*3/uL (ref 0.0–0.1)
Eosinophils Absolute: 0.1 10*3/uL (ref 0.0–0.7)
Eosinophils Relative: 0.7 % (ref 0.0–5.0)
HCT: 39.9 % (ref 36.0–46.0)
Hemoglobin: 13.5 g/dL (ref 12.0–15.0)
LYMPHS PCT: 36.7 % (ref 12.0–46.0)
Lymphs Abs: 2.8 10*3/uL (ref 0.7–4.0)
MCHC: 33.7 g/dL (ref 30.0–36.0)
MCV: 94.3 fl (ref 78.0–100.0)
MONOS PCT: 4.9 % (ref 3.0–12.0)
Monocytes Absolute: 0.4 10*3/uL (ref 0.1–1.0)
NEUTROS ABS: 4.3 10*3/uL (ref 1.4–7.7)
Neutrophils Relative %: 56 % (ref 43.0–77.0)
PLATELETS: 134 10*3/uL — AB (ref 150.0–400.0)
RBC: 4.24 Mil/uL (ref 3.87–5.11)
RDW: 12.8 % (ref 11.5–15.5)
WBC: 7.7 10*3/uL (ref 4.0–10.5)

## 2016-05-06 LAB — TSH: TSH: 1.82 u[IU]/mL (ref 0.35–4.50)

## 2016-05-06 LAB — LDL CHOLESTEROL, DIRECT: Direct LDL: 80 mg/dL

## 2016-05-06 LAB — LIPID PANEL
Cholesterol: 188 mg/dL (ref 0–200)
HDL: 93.8 mg/dL (ref >39.00)
LDL Cholesterol: 81 mg/dL (ref 0–99)
NonHDL: 94.61
TRIGLYCERIDES: 67 mg/dL (ref 0.0–149.0)
Total CHOL/HDL Ratio: 2
VLDL: 13.4 mg/dL (ref 0.0–40.0)

## 2016-05-06 LAB — CK: Total CK: 306 U/L — ABNORMAL HIGH (ref 7–177)

## 2016-05-06 LAB — VITAMIN D 25 HYDROXY (VIT D DEFICIENCY, FRACTURES): VITD: 61.53 ng/mL (ref 30.00–100.00)

## 2016-05-07 NOTE — Assessment & Plan Note (Signed)
Annual thyroid screen is normal  Lab Results  Component Value Date   TSH 1.82 05/05/2016

## 2016-05-07 NOTE — Assessment & Plan Note (Signed)
Persistent, mild, unchanged.   Lab Results  Component Value Date   WBC 7.7 05/05/2016   HGB 13.5 05/05/2016   HCT 39.9 05/05/2016   MCV 94.3 05/05/2016   PLT 134.0 (L) 05/05/2016

## 2016-05-07 NOTE — Assessment & Plan Note (Signed)
Annual comprehensive preventive exam was done as well as an evaluation and management of chronic conditions .  During the course of the visit the patient was educated and counseled about appropriate screening and preventive services including :  diabetes screening, lipid analysis with projected  10 year  risk for CAD , nutrition counseling, breast, cervical and colorectal cancer screening, and recommended immunizations.  Printed recommendations for health maintenance screenings was given.  Lab Results  Component Value Date   CHOL 188 05/05/2016   HDL 93.80 05/05/2016   LDLCALC 81 05/05/2016   LDLDIRECT 80.0 05/05/2016   TRIG 67.0 05/05/2016   CHOLHDL 2 05/05/2016

## 2016-05-07 NOTE — Assessment & Plan Note (Addendum)
Presumed secondary to muscle enzyme elevation for rigourous workouts.  She has continually deferred ultrasound. Prior serologic workup for autoimmune causes was negative.   Lab Results  Component Value Date   ALT 45 (H) 05/05/2016   AST 48 (H) 05/05/2016   ALKPHOS 91 05/05/2016   BILITOT 0.4 05/05/2016   Lab Results  Component Value Date   CKTOTAL 306 (H) 05/05/2016

## 2016-05-08 ENCOUNTER — Other Ambulatory Visit: Payer: Self-pay | Admitting: Internal Medicine

## 2016-05-08 DIAGNOSIS — R7989 Other specified abnormal findings of blood chemistry: Secondary | ICD-10-CM

## 2016-05-08 NOTE — Progress Notes (Signed)
Orders placed for labs

## 2016-05-14 ENCOUNTER — Other Ambulatory Visit: Payer: Self-pay

## 2016-05-28 ENCOUNTER — Encounter: Payer: Self-pay | Admitting: Internal Medicine

## 2016-05-29 ENCOUNTER — Ambulatory Visit
Admission: RE | Admit: 2016-05-29 | Discharge: 2016-05-29 | Disposition: A | Discharge status: Home / Self Care | Payer: BLUE CROSS/BLUE SHIELD | Source: Ambulatory Visit | Attending: Internal Medicine | Admitting: Internal Medicine

## 2016-05-29 DIAGNOSIS — Z1231 Encounter for screening mammogram for malignant neoplasm of breast: Secondary | ICD-10-CM | POA: Insufficient documentation

## 2016-05-29 DIAGNOSIS — Z1239 Encounter for other screening for malignant neoplasm of breast: Secondary | ICD-10-CM

## 2016-10-07 NOTE — Telephone Encounter (Signed)
Error

## 2016-10-07 NOTE — Telephone Encounter (Signed)
See order.

## 2017-09-08 ENCOUNTER — Ambulatory Visit: Payer: BLUE CROSS/BLUE SHIELD | Admitting: Internal Medicine

## 2017-09-08 ENCOUNTER — Encounter: Payer: Self-pay | Admitting: Internal Medicine

## 2017-09-08 VITALS — BP 114/70 | HR 60 | Temp 98.1°F | Resp 14 | Ht 63.5 in | Wt 135.6 lb

## 2017-09-08 DIAGNOSIS — Z1231 Encounter for screening mammogram for malignant neoplasm of breast: Secondary | ICD-10-CM | POA: Diagnosis not present

## 2017-09-08 DIAGNOSIS — E785 Hyperlipidemia, unspecified: Secondary | ICD-10-CM

## 2017-09-08 DIAGNOSIS — R5383 Other fatigue: Secondary | ICD-10-CM | POA: Diagnosis not present

## 2017-09-08 DIAGNOSIS — Z8639 Personal history of other endocrine, nutritional and metabolic disease: Secondary | ICD-10-CM

## 2017-09-08 DIAGNOSIS — Z0000 Encounter for general adult medical examination without abnormal findings: Secondary | ICD-10-CM

## 2017-09-08 DIAGNOSIS — Z1239 Encounter for other screening for malignant neoplasm of breast: Secondary | ICD-10-CM

## 2017-09-08 DIAGNOSIS — R748 Abnormal levels of other serum enzymes: Secondary | ICD-10-CM

## 2017-09-08 DIAGNOSIS — D696 Thrombocytopenia, unspecified: Secondary | ICD-10-CM

## 2017-09-08 MED ORDER — ZOSTER VAC RECOMB ADJUVANTED 50 MCG/0.5ML IM SUSR: 0.5000 mL | Freq: Once | INTRAMUSCULAR | 1 refills | Status: AC

## 2017-09-08 NOTE — Patient Instructions (Signed)
Mammogram has been ordered at [facility] PAP smear is due in 2020   Labs today  The ShingRx vaccine is now available in local pharmacies and is 97% protective against shingles .  It is therefore ADVISED for all interested adults over 50 to prevent shingles, rx attached.  check with your insurance about coverage    Your next colonoscopy or colon ca screening is due in 2024   Health Maintenance for Postmenopausal Women Menopause is a normal process in which your reproductive ability comes to an end. This process happens gradually over a span of months to years, usually between the ages of 29 and 58. Menopause is complete when you have missed 12 consecutive menstrual periods. It is important to talk with your health care provider about some of the most common conditions that affect postmenopausal women, such as heart disease, cancer, and bone loss (osteoporosis). Adopting a healthy lifestyle and getting preventive care can help to promote your health and wellness. Those actions can also lower your chances of developing some of these common conditions. What should I know about menopause? During menopause, you may experience a number of symptoms, such as:  Moderate-to-severe hot flashes.  Night sweats.  Decrease in sex drive.  Mood swings.  Headaches.  Tiredness.  Irritability.  Memory problems.  Insomnia.  Choosing to treat or not to treat menopausal changes is an individual decision that you make with your health care provider. What should I know about hormone replacement therapy and supplements? Hormone therapy products are effective for treating symptoms that are associated with menopause, such as hot flashes and night sweats. Hormone replacement carries certain risks, especially as you become older. If you are thinking about using estrogen or estrogen with progestin treatments, discuss the benefits and risks with your health care provider. What should I know about  heart disease and stroke? Heart disease, heart attack, and stroke become more likely as you age. This may be due, in part, to the hormonal changes that your body experiences during menopause. These can affect how your body processes dietary fats, triglycerides, and cholesterol. Heart attack and stroke are both medical emergencies. There are many things that you can do to help prevent heart disease and stroke:  Have your blood pressure checked at least every 1-2 years. High blood pressure causes heart disease and increases the risk of stroke.  If you are 19-29 years old, ask your health care provider if you should take aspirin to prevent a heart attack or a stroke.  Do not use any tobacco products, including cigarettes, chewing tobacco, or electronic cigarettes. If you need help quitting, ask your health care provider.  It is important to eat a healthy diet and maintain a healthy weight. ? Be sure to include plenty of vegetables, fruits, low-fat dairy products, and lean protein. ? Avoid eating foods that are high in solid fats, added sugars, or salt (sodium).  Get regular exercise. This is one of the most important things that you can do for your health. ? Try to exercise for at least 150 minutes each week. The type of exercise that you do should increase your heart rate and make you sweat. This is known as moderate-intensity exercise. ? Try to do strengthening exercises at least twice each week. Do these in addition to the moderate-intensity exercise.  Know your numbers.Ask your health care provider to check your cholesterol and your blood glucose. Continue to have your blood tested as directed by your health care  provider.  What should I know about cancer screening? There are several types of cancer. Take the following steps to reduce your risk and to catch any cancer development as early as possible. Breast Cancer  Practice breast self-awareness. ? This means understanding how your  breasts normally appear and feel. ? It also means doing regular breast self-exams. Let your health care provider know about any changes, no matter how small.  If you are 55 or older, have a clinician do a breast exam (clinical breast exam or CBE) every year. Depending on your age, family history, and medical history, it may be recommended that you also have a yearly breast X-ray (mammogram).  If you have a family history of breast cancer, talk with your health care provider about genetic screening.  If you are at high risk for breast cancer, talk with your health care provider about having an MRI and a mammogram every year.  Breast cancer (BRCA) gene test is recommended for women who have family members with BRCA-related cancers. Results of the assessment will determine the need for genetic counseling and BRCA1 and for BRCA2 testing. BRCA-related cancers include these types: ? Breast. This occurs in males or females. ? Ovarian. ? Tubal. This may also be called fallopian tube cancer. ? Cancer of the abdominal or pelvic lining (peritoneal cancer). ? Prostate. ? Pancreatic.  Cervical, Uterine, and Ovarian Cancer Your health care provider may recommend that you be screened regularly for cancer of the pelvic organs. These include your ovaries, uterus, and vagina. This screening involves a pelvic exam, which includes checking for microscopic changes to the surface of your cervix (Pap test).  For women ages 21-65, health care providers may recommend a pelvic exam and a Pap test every three years. For women ages 33-65, they may recommend the Pap test and pelvic exam, combined with testing for human papilloma virus (HPV), every five years. Some types of HPV increase your risk of cervical cancer. Testing for HPV may also be done on women of any age who have unclear Pap test results.  Other health care providers may not recommend any screening for nonpregnant women who are considered low risk for pelvic  cancer and have no symptoms. Ask your health care provider if a screening pelvic exam is right for you.  If you have had past treatment for cervical cancer or a condition that could lead to cancer, you need Pap tests and screening for cancer for at least 20 years after your treatment. If Pap tests have been discontinued for you, your risk factors (such as having a new sexual partner) need to be reassessed to determine if you should start having screenings again. Some women have medical problems that increase the chance of getting cervical cancer. In these cases, your health care provider may recommend that you have screening and Pap tests more often.  If you have a family history of uterine cancer or ovarian cancer, talk with your health care provider about genetic screening.  If you have vaginal bleeding after reaching menopause, tell your health care provider.  There are currently no reliable tests available to screen for ovarian cancer.  Lung Cancer Lung cancer screening is recommended for adults 46-64 years old who are at high risk for lung cancer because of a history of smoking. A yearly low-dose CT scan of the lungs is recommended if you:  Currently smoke.  Have a history of at least 30 pack-years of smoking and you currently smoke or have quit within  the past 15 years. A pack-year is smoking an average of one pack of cigarettes per day for one year.  Yearly screening should:  Continue until it has been 15 years since you quit.  Stop if you develop a health problem that would prevent you from having lung cancer treatment.  Colorectal Cancer  This type of cancer can be detected and can often be prevented.  Routine colorectal cancer screening usually begins at age 62 and continues through age 2.  If you have risk factors for colon cancer, your health care provider may recommend that you be screened at an earlier age.  If you have a family history of colorectal cancer, talk with  your health care provider about genetic screening.  Your health care provider may also recommend using home test kits to check for hidden blood in your stool.  A small camera at the end of a tube can be used to examine your colon directly (sigmoidoscopy or colonoscopy). This is done to check for the earliest forms of colorectal cancer.  Direct examination of the colon should be repeated every 5-10 years until age 26. However, if early forms of precancerous polyps or small growths are found or if you have a family history or genetic risk for colorectal cancer, you may need to be screened more often.  Skin Cancer  Check your skin from head to toe regularly.  Monitor any moles. Be sure to tell your health care provider: ? About any new moles or changes in moles, especially if there is a change in a mole's shape or color. ? If you have a mole that is larger than the size of a pencil eraser.  If any of your family members has a history of skin cancer, especially at a young age, talk with your health care provider about genetic screening.  Always use sunscreen. Apply sunscreen liberally and repeatedly throughout the day.  Whenever you are outside, protect yourself by wearing long sleeves, pants, a wide-brimmed hat, and sunglasses.  What should I know about osteoporosis? Osteoporosis is a condition in which bone destruction happens more quickly than new bone creation. After menopause, you may be at an increased risk for osteoporosis. To help prevent osteoporosis or the bone fractures that can happen because of osteoporosis, the following is recommended:  If you are 61-16 years old, get at least 1,000 mg of calcium and at least 600 mg of vitamin D per day.  If you are older than age 44 but younger than age 45, get at least 1,200 mg of calcium and at least 600 mg of vitamin D per day.  If you are older than age 42, get at least 1,200 mg of calcium and at least 800 mg of vitamin D per  day.  Smoking and excessive alcohol intake increase the risk of osteoporosis. Eat foods that are rich in calcium and vitamin D, and do weight-bearing exercises several times each week as directed by your health care provider. What should I know about how menopause affects my mental health? Depression may occur at any age, but it is more common as you become older. Common symptoms of depression include:  Low or sad mood.  Changes in sleep patterns.  Changes in appetite or eating patterns.  Feeling an overall lack of motivation or enjoyment of activities that you previously enjoyed.  Frequent crying spells.  Talk with your health care provider if you think that you are experiencing depression. What should I know about immunizations? It  is important that you get and maintain your immunizations. These include:  Tetanus, diphtheria, and pertussis (Tdap) booster vaccine.  Influenza every year before the flu season begins.  Pneumonia vaccine.  Shingles vaccine.  Your health care provider may also recommend other immunizations. This information is not intended to replace advice given to you by your health care provider. Make sure you discuss any questions you have with your health care provider. Document Released: 02/20/2005 Document Revised: 07/19/2015 Document Reviewed: 10/02/2014 Elsevier Interactive Patient Education  2018 [employer].

## 2017-09-08 NOTE — Progress Notes (Signed)
Patient ID: Crystal Erickson, female    DOB: February 01, 1960  Age: 57 y.o. MRN: [id number]  The patient is here for annual preventive examination and management of other chronic and acute problems.  [facility] suggested  By insurance RN screened.  Discussed pros and cons of testing now.    The risk factors are reflected in the social history.  The roster of all physicians providing medical care to patient - is listed in the Snapshot section of the chart.  Activities of daily living:  The patient is 100% independent in all ADLs: dressing, toileting, feeding as well as independent mobility  Home safety : The patient has smoke detectors in the home. They wear seatbelts.  There are no firearms at home. There is no violence in the home.   There is no risks for hepatitis, STDs or HIV. There is no   history of blood transfusion. They have no travel history to infectious disease endemic areas of the world.  The patient has seen their dentist in the last six month. They have seen their eye doctor in the last year. They deny  hearing difficulty   They do not  have excessive sun exposure. Discussed the need for sun protection: hats, long sleeves and use of sunscreen if there is significant sun exposure.   Diet: the importance of a healthy diet is discussed. They do have a healthy diet.  The benefits of regular aerobic exercise were discussed. She works out vigoruosly 6 days per week ,  [employer] minuts as a Physiological scientist. .   Depression screen: there are no signs or vegative symptoms of depression- irritability, change in appetite, anhedonia, sadness/tearfullness.  Cognitive assessment: the patient manages all their financial and personal affairs and is actively engaged. They could relate day,date,year and events; recalled 2/3 objects at 3 minutes; performed clock-face test normally.  The following portions of the patient's history were reviewed and updated as appropriate: allergies,  current medications, past family history, past medical history,  past surgical history, past social history  and problem list.  Visual acuity was not assessed per patient preference since she has regular follow up with her ophthalmologist. Hearing and body mass index were assessed and reviewed.   During the course of the visit the patient was educated and counseled about appropriate screening and preventive services including : fall prevention , diabetes screening, nutrition counseling, colorectal cancer screening, and recommended immunizations.    CC: The primary encounter diagnosis was Breast cancer screening. Diagnoses of Fatigue, unspecified type, Hyperlipidemia LDL goal <160, Encounter for preventive health examination, Elevated liver enzymes, History of Hashimoto thyroiditis, and Thrombocytopenia, unspecified ([place]) were also pertinent to this visit.  History Tongela has no past medical history on file.   She has no past surgical history on file.   Her family history includes Breast cancer in her maternal aunt; Cancer in her mother; Diabetes in her father; Mental illness (age of onset: 8) in her mother.She reports that she has never smoked. She has never used smokeless tobacco. She reports that she drinks alcohol. She reports that she does not use drugs.  Outpatient Medications Prior to Visit  Medication Sig Dispense Refill  . Ascorbic Acid (VITAMIN C) 1000 MG tablet Take 1,000 mg by mouth 4 (four) times daily.      Marland Kitchen b complex vitamins tablet Take 1 tablet by mouth 2 (two) times daily.      . cholecalciferol (VITAMIN D) 1000 UNITS tablet Take 1,000  Units by mouth daily.      . Glucosamine-Chondroit-Vit C-Mn (GLUCOSAMINE 1500 COMPLEX PO) Take by mouth 2 (two) times daily.      Marland Kitchen GLUTAMINE PO Take by mouth 3 (three) times daily.      . Multiple Vitamins-Minerals (MULTIVITAMIN PO) Take by mouth daily.      . Omega-3 Fatty Acids (FISH OIL MAXIMUM STRENGTH) 1200 MG CPDR Take by mouth 2 (two)  times daily.       No facility-administered medications prior to visit.     Review of Systems   Patient denies headache, fevers, malaise, unintentional weight loss, skin rash, eye pain, sinus congestion and sinus pain, sore throat, dysphagia,  hemoptysis , cough, dyspnea, wheezing, chest pain, palpitations, orthopnea, edema, abdominal pain, nausea, melena, diarrhea, constipation, flank pain, dysuria, hematuria, urinary  Frequency, nocturia, numbness, tingling, seizures,  Focal weakness, Loss of consciousness,  Tremor, insomnia, depression, anxiety, and suicidal ideation.      Objective:  BP 114/70 (BP Location: Left Arm, Patient Position: Sitting, Cuff Size: Normal)   Pulse 60   Temp 98.1 F (36.7 C) (Oral)   Resp 14   Ht 5' 3.5" (1.613 m)   Wt 135 lb 9.6 oz (61.5 kg)   LMP 08/13/2010   SpO2 98%   BMI 23.64 kg/m   Physical Exam  General appearance: alert, cooperative and appears stated age Head: Normocephalic, without obvious abnormality, atraumatic Eyes: conjunctivae/corneas clear. PERRL, EOM's intact. Fundi benign. Ears: normal TM's and external ear canals both ears Nose: Nares normal. Septum midline. Mucosa normal. No drainage or sinus tenderness. Throat: lips, mucosa, and tongue normal; teeth and gums normal Neck: no adenopathy, no carotid bruit, no JVD, supple, symmetrical, trachea midline and thyroid not enlarged, symmetric, no tenderness/mass/nodules Lungs: clear to auscultation bilaterally Breasts: normal appearance, no masses or tenderness Heart: regular rate and rhythm, S1, S2 normal, no murmur, click, rub or gallop Abdomen: soft, non-tender; bowel sounds normal; no masses,  no organomegaly Extremities: extremities normal, atraumatic, no cyanosis or edema Pulses: 2+ and symmetric Skin: Skin color, texture, turgor normal. No rashes or lesions Neurologic: Alert and oriented X 3, normal strength and tone. Normal symmetric reflexes. Normal coordination and gait.      Assessment & Plan:   Problem List Items Addressed This Visit    Elevated liver enzymes    Presumed secondary to muscle enzyme elevation for rigourous workouts.  She has continually deferred ultrasound. Prior serologic workup for autoimmune causes was negative.   Lab Results  Component Value Date   ALT 47 (H) 09/08/2017   AST 48 (H) 09/08/2017   ALKPHOS 92 09/08/2017   BILITOT 0.5 09/08/2017   Lab Results  Component Value Date   HFWYOVZ 858 (H) 05/05/2016         Encounter for preventive health examination    Annual comprehensive preventive exam was done as well as an evaluation and management of chronic conditions .  During the course of the visit the patient was educated and counseled about appropriate screening and preventive services including :  diabetes screening, lipid analysis with projected  10 year  risk for CAD , nutrition counseling, breast, cervical and colorectal cancer screening, and recommended immunizations.  Printed recommendations for health maintenance screenings was given      History of Hashimoto thyroiditis    Annual thyroid function screen is normal  Lab Results  Component Value Date   TSH 2.22 09/08/2017          Thrombocytopenia, unspecified ([place])  Persistent, mild, unchanged.  She has historically deferred workup  Lab Results  Component Value Date   WBC 5.2 09/08/2017   HGB 13.5 09/08/2017   HCT 40.0 09/08/2017   MCV 93.5 09/08/2017   PLT 121.0 (L) 09/08/2017          Other Visit Diagnoses    Breast cancer screening    -  Primary   Relevant Orders   MM DIGITAL SCREENING BILATERAL   Fatigue, unspecified type       Relevant Orders   Comprehensive metabolic panel (Completed)   TSH (Completed)   CBC with Differential/Platelet (Completed)   Hyperlipidemia LDL goal <160       Relevant Orders   Lipid panel (Completed)      I am having [place] start on Zoster Vaccine Adjuvanted. I am also having her maintain her  Multiple Vitamins-Minerals (MULTIVITAMIN PO), FISH OIL MAXIMUM STRENGTH, b complex vitamins, vitamin C, cholecalciferol, GLUTAMINE PO, and Glucosamine-Chondroit-Vit C-Mn (GLUCOSAMINE 1500 COMPLEX PO).  Meds ordered this encounter  Medications  . Zoster Vaccine Adjuvanted [facility]) injection    Sig: Inject 0.5 mLs into the muscle once for 1 dose.    Dispense:  1 each    Refill:  1    There are no discontinued medications.  Follow-up: No follow-ups on file.   Crecencio Mc, MD

## 2017-09-09 LAB — COMPREHENSIVE METABOLIC PANEL
ALT: 47 U/L — AB (ref 0–35)
AST: 48 U/L — AB (ref 0–37)
Albumin: 4 g/dL (ref 3.5–5.2)
Alkaline Phosphatase: 92 U/L (ref 39–117)
BILIRUBIN TOTAL: 0.5 mg/dL (ref 0.2–1.2)
BUN: 40 mg/dL — ABNORMAL HIGH (ref 6–23)
CO2: 30 mEq/L (ref 19–32)
CREATININE: 0.88 mg/dL (ref 0.40–1.20)
Calcium: 9.3 mg/dL (ref 8.4–10.5)
Chloride: 102 mEq/L (ref 96–112)
GFR: 70.34 mL/min (ref >60.00)
GLUCOSE: 92 mg/dL (ref 70–99)
Potassium: 5 mEq/L (ref 3.5–5.1)
Sodium: 138 mEq/L (ref 135–145)
Total Protein: 6.4 g/dL (ref 6.0–8.3)

## 2017-09-09 LAB — CBC WITH DIFFERENTIAL/PLATELET
BASOS ABS: 0 10*3/uL (ref 0.0–0.1)
Basophils Relative: 0.9 % (ref 0.0–3.0)
EOS ABS: 0.2 10*3/uL (ref 0.0–0.7)
Eosinophils Relative: 2.9 % (ref 0.0–5.0)
HCT: 40 % (ref 36.0–46.0)
Hemoglobin: 13.5 g/dL (ref 12.0–15.0)
LYMPHS PCT: 37.1 % (ref 12.0–46.0)
Lymphs Abs: 1.9 10*3/uL (ref 0.7–4.0)
MCHC: 33.8 g/dL (ref 30.0–36.0)
MCV: 93.5 fl (ref 78.0–100.0)
MONO ABS: 0.2 10*3/uL (ref 0.1–1.0)
Monocytes Relative: 4.7 % (ref 3.0–12.0)
NEUTROS ABS: 2.8 10*3/uL (ref 1.4–7.7)
Neutrophils Relative %: 54.4 % (ref 43.0–77.0)
PLATELETS: 121 10*3/uL — AB (ref 150.0–400.0)
RBC: 4.28 Mil/uL (ref 3.87–5.11)
RDW: 13.6 % (ref 11.5–15.5)
WBC: 5.2 10*3/uL (ref 4.0–10.5)

## 2017-09-09 LAB — LIPID PANEL
CHOL/HDL RATIO: 2
Cholesterol: 186 mg/dL (ref 0–200)
HDL: 89.2 mg/dL (ref >39.00)
LDL CALC: 75 mg/dL (ref 0–99)
NONHDL: 97.05
Triglycerides: 111 mg/dL (ref 0.0–149.0)
VLDL: 22.2 mg/dL (ref 0.0–40.0)

## 2017-09-09 LAB — TSH: TSH: 2.22 u[IU]/mL (ref 0.35–4.50)

## 2017-09-11 NOTE — Assessment & Plan Note (Signed)
Presumed secondary to muscle enzyme elevation for rigourous workouts.  She has continually deferred ultrasound. Prior serologic workup for autoimmune causes was negative.   Lab Results  Component Value Date   ALT 47 (H) 09/08/2017   AST 48 (H) 09/08/2017   ALKPHOS 92 09/08/2017   BILITOT 0.5 09/08/2017   Lab Results  Component Value Date   CKTOTAL 306 (H) 05/05/2016

## 2017-09-11 NOTE — Assessment & Plan Note (Signed)
Annual thyroid function screen is normal  Lab Results  Component Value Date   TSH 2.22 09/08/2017

## 2017-09-11 NOTE — Assessment & Plan Note (Signed)
Annual comprehensive preventive exam was done as well as an evaluation and management of chronic conditions .  During the course of the visit the patient was educated and counseled about appropriate screening and preventive services including :  diabetes screening, lipid analysis with projected  10 year  risk for CAD , nutrition counseling, breast, cervical and colorectal cancer screening, and recommended immunizations.  Printed recommendations for health maintenance screenings was given 

## 2017-09-11 NOTE — Assessment & Plan Note (Signed)
Persistent, mild, unchanged.  She has historically deferred workup  Lab Results  Component Value Date   WBC 5.2 09/08/2017   HGB 13.5 09/08/2017   HCT 40.0 09/08/2017   MCV 93.5 09/08/2017   PLT 121.0 (L) 09/08/2017

## 2017-10-14 LAB — HM MAMMOGRAPHY

## 2019-04-09 LAB — HM PAP SMEAR: HM Pap smear: NORMAL

## 2019-04-26 ENCOUNTER — Encounter: Payer: BLUE CROSS/BLUE SHIELD | Admitting: Internal Medicine

## 2020-08-14 ENCOUNTER — Other Ambulatory Visit: Payer: Self-pay | Admitting: Internal Medicine

## 2020-08-14 DIAGNOSIS — Z1231 Encounter for screening mammogram for malignant neoplasm of breast: Secondary | ICD-10-CM

## 2020-08-16 LAB — HM MAMMOGRAPHY

## 2020-10-01 ENCOUNTER — Other Ambulatory Visit: Payer: Self-pay

## 2020-10-01 MED ORDER — INFLUENZA VAC SPLIT QUAD 0.5 ML IM SUSY
PREFILLED_SYRINGE | INTRAMUSCULAR | 0 refills | Status: DC
  Filled 2020-10-01: qty 0.5, 1d supply, fill #0

## 2021-03-24 ENCOUNTER — Encounter: Payer: Self-pay | Admitting: Internal Medicine

## 2021-03-24 ENCOUNTER — Telehealth: Payer: Self-pay | Admitting: Internal Medicine

## 2021-03-24 NOTE — Telephone Encounter (Signed)
Pt has not been seen since 2019. She sent Dr Derrel Nip a message in 2019 stating that she was changing providers due to insurance. Did pt say anything about this when she called?  ?

## 2021-03-24 NOTE — Telephone Encounter (Signed)
Pt need referral for her varicose veins to [facility]. Pt provided phone number [phone] ?

## 2021-04-15 ENCOUNTER — Ambulatory Visit (INDEPENDENT_AMBULATORY_CARE_PROVIDER_SITE_OTHER): Payer: 59 | Admitting: Internal Medicine

## 2021-04-15 ENCOUNTER — Encounter: Payer: Self-pay | Admitting: Internal Medicine

## 2021-04-15 VITALS — BP 102/68 | HR 61 | Temp 97.8°F | Ht 63.5 in | Wt 128.4 lb

## 2021-04-15 DIAGNOSIS — R748 Abnormal levels of other serum enzymes: Secondary | ICD-10-CM

## 2021-04-15 DIAGNOSIS — R5383 Other fatigue: Secondary | ICD-10-CM

## 2021-04-15 DIAGNOSIS — I83813 Varicose veins of bilateral lower extremities with pain: Secondary | ICD-10-CM | POA: Insufficient documentation

## 2021-04-15 DIAGNOSIS — E785 Hyperlipidemia, unspecified: Secondary | ICD-10-CM | POA: Diagnosis not present

## 2021-04-15 DIAGNOSIS — I8393 Asymptomatic varicose veins of bilateral lower extremities: Secondary | ICD-10-CM

## 2021-04-15 NOTE — Assessment & Plan Note (Addendum)
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 No history of phlebitis or  DVT.  Referral to [facility] for treatment ?

## 2021-04-15 NOTE — Progress Notes (Signed)
? ?Subjective:  ?Patient ID: Crystal Erickson, female    DOB: 1960-09-22  Age: 62 y.o. MRN: [id number] ? ?CC: The primary encounter diagnosis was Elevated liver enzymes. Diagnoses of Hyperlipidemia, unspecified hyperlipidemia type, Other fatigue, and Asymptomatic varicose veins of both lower extremities were also pertinent to this visit. ? ? ?This visit occurred during the SARS-CoV-2 public health emergency.  Safety protocols were in place, including screening questions prior to the visit, additional usage of staff PPE, and extensive cleaning of exam room while observing appropriate contact time as indicated for disinfecting solutions.   ? ?HP ?Crandall presents for referral for varicose veins ?Chief Complaint  ?Patient presents with  ? Follow-up  ?  Needs a referral to Vein and Vascular  ? 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  Was treated with sclerotherapy 30 yrs ago on the right leg ,  but the problem has returned,  and she now has prominent veins on the left leg .    No episodes of phlebitis.    No history of DVT  ? ?Health maintenance:  UTD on cervical CA, BRCA and colon CA screening  ?  ? ?Outpatient Medications Prior to Visit  ?Medication Sig Dispense Refill  ? Ascorbic Acid (VITAMIN C) 1000 MG tablet Take 1,000 mg by mouth 4 (four) times daily.      ? b complex vitamins tablet Take 1 tablet by mouth 2 (two) times daily.      ? cholecalciferol (VITAMIN D) 1000 UNITS tablet Take 1,000 Units by mouth daily.      ? Glucosamine-Chondroit-Vit C-Mn (GLUCOSAMINE 1500 COMPLEX PO) Take by mouth 2 (two) times daily.      ? GLUTAMINE PO Take by mouth 3 (three) times daily.      ? Multiple Vitamins-Minerals (MULTIVITAMIN PO) Take by mouth daily.      ? Omega-3 Fatty Acids (FISH OIL MAXIMUM STRENGTH) 1200 MG CPDR Take by mouth 2 (two) times daily.      ? influenza vac split quadrivalent  PF (FLUARIX) 0.5 ML injection Inject into the muscle. 0.5 mL 0  ? ?No facility-administered medications prior to visit.  ? ? ?Review of Systems; ? ?Patient denies headache, fevers, malaise, unintentional weight loss, skin rash, eye pain, sinus congestion and sinus pain, sore throat, dysphagia,  hemoptysis , cough, dyspnea, wheezing, chest pain, palpitations, orthopnea, edema, abdominal pain, nausea, melena, diarrhea, constipation, flank pain, dysuria, hematuria, urinary  Frequency, nocturia, numbness, tingling, seizures,  Focal weakness, Loss of consciousness,  Tremor, insomnia, depression, anxiety, and suicidal ideation.   ? ? ? ?Objective:  ?BP 102/68 (BP Location: Left Arm, Patient Position: Sitting, Cuff Size: Normal)   Pulse 61   Temp 97.8 ?F (36.6 ?C) (Oral)   Ht 5' 3.5" (1.613 m)   Wt 128 lb 6.4 oz (58.2 kg)   LMP 08/13/2010   SpO2 97%   BMI 22.39 kg/m?  ? ?BP Readings from Last 3 Encounters:  ?04/15/21 102/68  ?09/08/17 114/70  ?05/05/16 108/66  ? ? ?Wt Readings from Last 3 Encounters:  ?04/15/21 128 lb 6.4 oz (58.2 kg)  ?09/08/17 135 lb 9.6 oz (61.5 kg)  ?05/05/16 134 lb (60.8 kg)  ? ? ?General appearance: alert, cooperative and appears stated age ?Ears: normal TM's and external ear canals both ears ?Throat: lips, mucosa, and tongue normal; teeth and gums normal ?  Neck: no adenopathy, no carotid bruit, supple, symmetrical, trachea midline and thyroid not enlarged, symmetric, no tenderness/mass/nodules ?Back: symmetric, no curvature. ROM normal. No CVA tenderness. ?Lungs: clear to auscultation bilaterally ?Heart: regular rate and rhythm, S1, S2 normal, no murmur, click, rub or gallop ?Abdomen: soft, non-tender; bowel sounds normal; no masses,  no organomegaly ?Pulses: 2+ and symmetric ?Skin: Skin color, texture, turgor normal. No rashes or lesions ?Lymph nodes: Cervical, supraclavicular, and axillary nodes normal. ? ?No results found for: HGBA1C ? ?Lab Results  ?Component Value Date  ? CREATININE 0.88  09/08/2017  ? CREATININE 1.20 05/05/2016  ? CREATININE 0.64 03/05/2015  ? ? ?Lab Results  ?Component Value Date  ? WBC 5.2 09/08/2017  ? HGB 13.5 09/08/2017  ? HCT 40.0 09/08/2017  ? PLT 121.0 (L) 09/08/2017  ? GLUCOSE 92 09/08/2017  ? CHOL 186 09/08/2017  ? TRIG 111.0 09/08/2017  ? HDL 89.20 09/08/2017  ? LDLDIRECT 80.0 05/05/2016  ? [place] 75 09/08/2017  ? ALT 47 (H) 09/08/2017  ? AST 48 (H) 09/08/2017  ? NA 138 09/08/2017  ? K 5.0 09/08/2017  ? CL 102 09/08/2017  ? CREATININE 0.88 09/08/2017  ? BUN 40 (H) 09/08/2017  ? CO2 30 09/08/2017  ? TSH 2.22 09/08/2017  ? ? ?MM SCREENING BREAST TOMO BILATERAL ? ?Result Date: 05/29/2016 ?CLINICAL DATA:  Screening. EXAM: 2D DIGITAL SCREENING BILATERAL MAMMOGRAM WITH CAD AND ADJUNCT TOMO COMPARISON:  Previous exam(s). ACR Breast Density Category b: There are scattered areas of fibroglandular density. FINDINGS: There are no findings suspicious for malignancy. Images were processed with CAD. IMPRESSION: No mammographic evidence of malignancy. A result letter of this screening mammogram will be mailed directly to the patient. RECOMMENDATION: Screening mammogram in one year. (Code:SM-B-01Y) BI-RADS CATEGORY  1: Negative. Electronically Signed   By: Curlene Dolphin M.D.   On: 05/29/2016 16:23  ? ? ?Assessment & Plan:  ? ?Problem List Items Addressed This Visit   ? ? RESOLVED: Elevated liver enzymes - Primary  ? Asymptomatic varicose veins of both lower extremities  ?  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 No history of phlebitis or  DVT.  Referral to [facility] for treatment ?  ?  ? Relevant Orders  ? Ambulatory referral to Vascular Surgery  ? ?Other Visit Diagnoses   ? ? Hyperlipidemia, unspecified hyperlipidemia type      ? Other fatigue      ? ?  ? ? ?I spent 30 minutes dedicated to the care of this patient on the date of this encounter to include pre-visit review of patient's medical history,  most recent  office visits with me and patient's  specialists,  most recent ER visit/hospitalization, EKG, imaging studies, Face-to-face time with the patient , and post visit ordering of testing and therapeutics.   ? ?Follow-up: No follow-ups on file. ? ? ?Crecencio Mc, MD ?

## 2021-04-15 NOTE — Patient Instructions (Signed)
Good to see you! ? ?You look amazing!    Don't lose any more weight.... ? ?Referral to [facility] in progress  ?

## 2021-06-03 ENCOUNTER — Ambulatory Visit (INDEPENDENT_AMBULATORY_CARE_PROVIDER_SITE_OTHER): Payer: 59 | Admitting: Vascular Surgery

## 2021-06-03 ENCOUNTER — Encounter (INDEPENDENT_AMBULATORY_CARE_PROVIDER_SITE_OTHER): Payer: Self-pay | Admitting: Vascular Surgery

## 2021-06-03 VITALS — BP 125/82 | HR 76 | Resp 17 | Ht 64.0 in | Wt 130.0 lb

## 2021-06-03 DIAGNOSIS — I83813 Varicose veins of bilateral lower extremities with pain: Secondary | ICD-10-CM | POA: Diagnosis not present

## 2021-06-03 NOTE — Assessment & Plan Note (Signed)
Recommend:  The patient has large symptomatic varicose veins that are painful and associated with swelling.  I have had a long discussion with the patient regarding  varicose veins and why they cause symptoms.  Patient will begin wearing graduated compression stockings class 1 on a daily basis, beginning first thing in the morning and removing them in the evening. The patient is instructed specifically not to sleep in the stockings.  She has already been using the stockings regularly for years.  The patient  will also begin using over-the-counter analgesics such as Motrin 600 mg po TID to help control the symptoms.    In addition, behavioral modification including elevation during the day will be initiated.    Pending the results of these changes the  patient will be reevaluated with a duplex in the future.   An ultrasound of the venous system will be obtained.   Further plans will be based on the ultrasound results and whether conservative therapies are successful at eliminating the pain and swelling.

## 2021-06-03 NOTE — Progress Notes (Signed)
Patient ID: Crystal Erickson, female   DOB: August 20, 1960, 61 y.o.   MRN: [id number]  Chief Complaint  Patient presents with   Establish Care     Referred by Dr Derrel Nip    HPI Crystal Erickson is a 61 y.o. female.  I am asked to see the patient by Dr. Derrel Nip for evaluation of varicose veins.  The patient has had varicose veins of both lower extremities that have been gradually worsening over many years.  She had treatment for venous disease 20 or 30 years ago as she recalls.  She noticed this much more prominently after her third child was born.  The right leg is the more severely affected of the 2 legs.  No previous history of DVT or superficial thrombophlebitis to her knowledge.  Patient denies any open wounds or infection.  No fevers or chills.  Her varicosities have itching and burning locally over the varicose veins and she has noticed more tenderness and fullness in her legs particularly on the right side.  She is very active and in great shape and uses compression when she works out every day.  She has been wearing compression socks and elevating her legs for years at this point.     No past medical history on file.  No past surgical history on file.   Family History  Problem Relation Age of Onset   Mental illness Mother 59       alzheimer's   Cancer Mother        metastatic unknown primary   Diabetes Father    Breast cancer Maternal Aunt      Social History   Tobacco Use   Smoking status: Never   Smokeless tobacco: Never  Substance Use Topics   Alcohol use: Yes    Alcohol/week: 0.0 standard drinks   Drug use: No    No Known Allergies  Current Outpatient Medications  Medication Sig Dispense Refill   Ascorbic Acid (VITAMIN C) 1000 MG tablet Take 1,000 mg by mouth 4 (four) times daily.       b complex vitamins tablet Take 1 tablet by mouth 2 (two) times daily.       cholecalciferol (VITAMIN D) 1000 UNITS tablet Take 1,000 Units by mouth daily.        Glucosamine-Chondroit-Vit C-Mn (GLUCOSAMINE 1500 COMPLEX PO) Take by mouth 2 (two) times daily.       GLUTAMINE PO Take by mouth 3 (three) times daily.       Multiple Vitamins-Minerals (MULTIVITAMIN PO) Take by mouth daily.       Omega-3 Fatty Acids (FISH OIL MAXIMUM STRENGTH) 1200 MG CPDR Take by mouth 2 (two) times daily.       No current facility-administered medications for this visit.      REVIEW OF SYSTEMS (Negative unless checked)  Constitutional: '[]'$ Weight loss  '[]'$ Fever  '[]'$ Chills Cardiac: '[]'$ Chest pain   '[]'$ Chest pressure   '[]'$ Palpitations   '[]'$ Shortness of breath when laying flat   '[]'$ Shortness of breath at rest   '[]'$ Shortness of breath with exertion. Vascular:  '[]'$ Pain in legs with walking   '[]'$ Pain in legs at rest   '[]'$ Pain in legs when laying flat   '[]'$ Claudication   '[]'$ Pain in feet when walking  '[]'$ Pain in feet at rest  '[]'$ Pain in feet when laying flat   '[]'$ History of DVT   '[]'$ Phlebitis   '[]'$ Swelling in legs   '[x]'$ Varicose veins   '[]'$ Non-healing ulcers Pulmonary:   '[]'$ Uses home oxygen   '[]'$ Productive  cough   '[]'$ Hemoptysis   '[]'$ Wheeze  '[]'$ COPD   '[]'$ Asthma Neurologic:  '[]'$ Dizziness  '[]'$ Blackouts   '[]'$ Seizures   '[]'$ History of stroke   '[]'$ History of TIA  '[]'$ Aphasia   '[]'$ Temporary blindness   '[]'$ Dysphagia   '[]'$ Weakness or numbness in arms   '[]'$ Weakness or numbness in legs Musculoskeletal:  '[]'$ Arthritis   '[]'$ Joint swelling   '[]'$ Joint pain   '[]'$ Low back pain Hematologic:  '[]'$ Easy bruising  '[]'$ Easy bleeding   '[]'$ Hypercoagulable state   '[]'$ Anemic  '[]'$ Hepatitis Gastrointestinal:  '[]'$ Blood in stool   '[]'$ Vomiting blood  '[]'$ Gastroesophageal reflux/heartburn   '[]'$ Abdominal pain Genitourinary:  '[]'$ Chronic kidney disease   '[]'$ Difficult urination  '[]'$ Frequent urination  '[]'$ Burning with urination   '[]'$ Hematuria Skin:  '[]'$ Rashes   '[]'$ Ulcers   '[]'$ Wounds Psychological:  '[]'$ History of anxiety   '[]'$  History of major depression.    Physical Exam BP 125/82 (BP Location: Right Arm)   Pulse 76   Resp 17   Ht '5\' 4"'$  (1.626 m)   Wt 130 lb (59 kg)   LMP  08/13/2010   BMI 22.31 kg/m  Gen:  WD/WN, NAD. Appears younger than stated age.  Head: [place]/AT, No temporalis wasting Ear/Nose/Throat: Hearing grossly intact, nares w/o erythema or drainage, oropharynx w/o Erythema/Exudate Eyes: Conjunctiva clear, sclera non-icteric  Neck: trachea midline.  No JVD.  Pulmonary:  Good air movement, respirations not labored, no use of accessory muscles  Cardiac: RRR, no JVD Vascular:  Vessel Right Left  Radial Palpable Palpable                                   Gastrointestinal:. No masses, surgical incisions, or scars. Musculoskeletal: M/S 5/5 throughout.  Extremities without ischemic changes.  No deformity or atrophy.  Prominent varicose veins worse on the right leg than the left leg measuring up to 2 to 3 mm.  Trace lower extremity edema. Neurologic: Sensation grossly intact in extremities.  Symmetrical.  Speech is fluent. Motor exam as listed above. Psychiatric: Judgment intact, Mood & affect appropriate for pt's clinical situation. Dermatologic: No rashes or ulcers noted.  No cellulitis or open wounds.    Radiology No results found.  Labs No results found for this or any previous visit (from the past 2160 hour(s)).  Assessment/Plan:  Varicose veins of leg with pain, bilateral  Recommend:  The patient has large symptomatic varicose veins that are painful and associated with swelling.  I have had a long discussion with the patient regarding  varicose veins and why they cause symptoms.  Patient will begin wearing graduated compression stockings class 1 on a daily basis, beginning first thing in the morning and removing them in the evening. The patient is instructed specifically not to sleep in the stockings.  She has already been using the stockings regularly for years.  The patient  will also begin using over-the-counter analgesics such as Motrin 600 mg po TID to help control the symptoms.    In addition, behavioral modification including  elevation during the day will be initiated.    Pending the results of these changes the  patient will be reevaluated with a duplex in the future.   An ultrasound of the venous system will be obtained.   Further plans will be based on the ultrasound results and whether conservative therapies are successful at eliminating the pain and swelling.       Leotis Pain 06/03/2021, 11:04 AM   This note was created with  Dragon medical transcription system.  Any errors from dictation are unintentional.

## 2021-06-03 NOTE — Patient Instructions (Signed)

## 2021-07-22 ENCOUNTER — Ambulatory Visit (INDEPENDENT_AMBULATORY_CARE_PROVIDER_SITE_OTHER): Payer: 59

## 2021-07-22 ENCOUNTER — Ambulatory Visit (INDEPENDENT_AMBULATORY_CARE_PROVIDER_SITE_OTHER): Payer: 59 | Admitting: Nurse Practitioner

## 2021-07-22 ENCOUNTER — Encounter (INDEPENDENT_AMBULATORY_CARE_PROVIDER_SITE_OTHER): Payer: Self-pay | Admitting: Nurse Practitioner

## 2021-07-22 VITALS — BP 128/81 | HR 58 | Resp 16 | Ht 64.0 in | Wt 127.0 lb

## 2021-07-22 DIAGNOSIS — I83813 Varicose veins of bilateral lower extremities with pain: Secondary | ICD-10-CM

## 2021-08-04 ENCOUNTER — Encounter (INDEPENDENT_AMBULATORY_CARE_PROVIDER_SITE_OTHER): Payer: Self-pay | Admitting: Nurse Practitioner

## 2021-08-04 NOTE — Progress Notes (Signed)
Subjective:    Patient ID: Crystal Erickson, female    DOB: 04/30/1960, 61 y.o.   MRN: [id number] No chief complaint on file.   Symone A Romick is a 61 y.o. female.  She returns today for evaluation of painful varicosities.  The patient has had varicose veins of both lower extremities that have been gradually worsening over many years.  She had treatment for venous disease 20 or 30 years ago as she recalls.  She noticed this much more prominently after her third child was born.  The right leg is the more severely affected of the 2 legs.  No previous history of DVT or superficial thrombophlebitis to her knowledge.  Patient denies any open wounds or infection.  No fevers or chills.  Her varicosities have itching and burning locally over the varicose veins and she has noticed more tenderness and fullness in her legs particularly on the right side.  She is very active and in great shape and uses compression when she works out every day.  She has been wearing compression socks and elevating her legs for years at this point.  Today noninvasive studies show evidence of superficial venous reflux in the bilateral great saphenous veins.  There is deep venous insufficiency in the left lower extremity with none on the right.  No evidence of DVT or superficial thrombophlebitis bilaterally.    Review of Systems  Cardiovascular:  Positive for leg swelling.  All other systems reviewed and are negative.      Objective:   Physical Exam Vitals reviewed.  Cardiovascular:     Rate and Rhythm: Normal rate.     Pulses: Normal pulses.  Pulmonary:     Effort: Pulmonary effort is normal.  Musculoskeletal:        General: Tenderness present.     Right lower leg: Edema present.  Neurological:     Mental Status: She is alert and oriented to person, place, and time.  Psychiatric:        Mood and Affect: Mood normal.        Behavior: Behavior normal.        Thought Content: Thought content normal.        Judgment:  Judgment normal.     BP 128/81 (BP Location: Right Arm)   Pulse (!) 58   Resp 16   Ht '5\' 4"'$  (1.626 m)   Wt 127 lb (57.6 kg)   LMP 08/13/2010   BMI 21.80 kg/m   History reviewed. No pertinent past medical history.  Social History   Socioeconomic History   Marital status: Single    Spouse name: Not on file   Number of children: Not on file   Years of education: Not on file   Highest education level: Not on file  Occupational History   Occupation: Physiological scientist    Employer: [employer]  Tobacco Use   Smoking status: Never   Smokeless tobacco: Never  Substance and Sexual Activity   Alcohol use: Yes    Alcohol/week: 0.0 standard drinks of alcohol   Drug use: No   Sexual activity: Not on file  Other Topics Concern   Not on file  Social History Narrative   Not on file   Social Determinants of Health   Financial Resource Strain: Not on file  Food Insecurity: Not on file  Transportation Needs: Not on file  Physical Activity: Not on file  Stress: Not on file  Social Connections: Not on file  Intimate Partner Violence:  Not on file    History reviewed. No pertinent surgical history.  Family History  Problem Relation Age of Onset   Mental illness Mother 92       alzheimer's   Cancer Mother        metastatic unknown primary   Diabetes Father    Breast cancer Maternal Aunt     No Known Allergies     Latest Ref Rng & Units 09/08/2017    3:25 PM 05/05/2016    4:25 PM 05/31/2013   11:40 AM  CBC  WBC 4.0 - 10.5 K/uL 5.2  7.7  5.0   Hemoglobin 12.0 - 15.0 g/dL 13.5  13.5  13.4   Hematocrit 36.0 - 46.0 % 40.0  39.9  39.0   Platelets 150.0 - 400.0 K/uL 121.0  134.0  118.0       CMP     Component Value Date/Time   NA 138 09/08/2017 1525   K 5.0 09/08/2017 1525   CL 102 09/08/2017 1525   CO2 30 09/08/2017 1525   GLUCOSE 92 09/08/2017 1525   BUN 40 (H) 09/08/2017 1525   CREATININE 0.88 09/08/2017 1525   CALCIUM 9.3 09/08/2017 1525   PROT 6.4  09/08/2017 1525   ALBUMIN 4.0 09/08/2017 1525   AST 48 (H) 09/08/2017 1525   ALT 47 (H) 09/08/2017 1525   ALKPHOS 92 09/08/2017 1525   BILITOT 0.5 09/08/2017 1525     No results found.     Assessment & Plan:   1. Varicose veins of leg with pain, bilateral Recommend  I have reviewed my previous  discussion with the patient regarding  varicose veins and why they cause symptoms. Patient will continue  wearing graduated compression stockings class 1 on a daily basis, beginning first thing in the morning and removing them in the evening.    In addition, behavioral modification including elevation during the day was again discussed and this will continue.  The patient has utilized over the counter pain medications and has been exercising.  However, at this time conservative therapy has not alleviated the patient's symptoms of leg pain and swelling  Recommend: laser ablation of the right great saphenous veins to eliminate the symptoms of pain and swelling of the lower extremities caused by the severe superficial venous reflux disease.     Current Outpatient Medications on File Prior to Visit  Medication Sig Dispense Refill   Ascorbic Acid (VITAMIN C) 1000 MG tablet Take 1,000 mg by mouth 4 (four) times daily.       b complex vitamins tablet Take 1 tablet by mouth 2 (two) times daily.       cholecalciferol (VITAMIN D) 1000 UNITS tablet Take 1,000 Units by mouth daily.       Glucosamine-Chondroit-Vit C-Mn (GLUCOSAMINE 1500 COMPLEX PO) Take by mouth 2 (two) times daily.       GLUTAMINE PO Take by mouth 3 (three) times daily.       Multiple Vitamins-Minerals (MULTIVITAMIN PO) Take by mouth daily.       Omega-3 Fatty Acids (FISH OIL MAXIMUM STRENGTH) 1200 MG CPDR Take by mouth 2 (two) times daily.       No current facility-administered medications on file prior to visit.    There are no Patient Instructions on file for this visit. No follow-ups on file.   Kris Hartmann, NP

## 2021-08-07 ENCOUNTER — Encounter (INDEPENDENT_AMBULATORY_CARE_PROVIDER_SITE_OTHER): Payer: Self-pay | Admitting: *Deleted

## 2021-08-12 DIAGNOSIS — M9901 Segmental and somatic dysfunction of cervical region: Secondary | ICD-10-CM | POA: Diagnosis not present

## 2021-08-12 DIAGNOSIS — M9902 Segmental and somatic dysfunction of thoracic region: Secondary | ICD-10-CM | POA: Diagnosis not present

## 2021-08-12 DIAGNOSIS — M5033 Other cervical disc degeneration, cervicothoracic region: Secondary | ICD-10-CM | POA: Diagnosis not present

## 2021-08-12 DIAGNOSIS — M62830 Muscle spasm of back: Secondary | ICD-10-CM | POA: Diagnosis not present

## 2021-08-13 ENCOUNTER — Encounter: Payer: Self-pay | Admitting: Internal Medicine

## 2021-08-13 ENCOUNTER — Telehealth (INDEPENDENT_AMBULATORY_CARE_PROVIDER_SITE_OTHER): Payer: Self-pay | Admitting: Vascular Surgery

## 2021-08-13 ENCOUNTER — Ambulatory Visit (INDEPENDENT_AMBULATORY_CARE_PROVIDER_SITE_OTHER): Payer: 59 | Admitting: Internal Medicine

## 2021-08-13 VITALS — BP 104/64 | HR 60 | Temp 97.9°F | Resp 14 | Ht 64.0 in | Wt 130.4 lb

## 2021-08-13 DIAGNOSIS — R748 Abnormal levels of other serum enzymes: Secondary | ICD-10-CM | POA: Diagnosis not present

## 2021-08-13 DIAGNOSIS — R5383 Other fatigue: Secondary | ICD-10-CM

## 2021-08-13 DIAGNOSIS — Z0000 Encounter for general adult medical examination without abnormal findings: Secondary | ICD-10-CM

## 2021-08-13 DIAGNOSIS — D696 Thrombocytopenia, unspecified: Secondary | ICD-10-CM | POA: Diagnosis not present

## 2021-08-13 DIAGNOSIS — Z8639 Personal history of other endocrine, nutritional and metabolic disease: Secondary | ICD-10-CM

## 2021-08-13 DIAGNOSIS — E785 Hyperlipidemia, unspecified: Secondary | ICD-10-CM | POA: Diagnosis not present

## 2021-08-13 DIAGNOSIS — Z114 Encounter for screening for human immunodeficiency virus [HIV]: Secondary | ICD-10-CM | POA: Diagnosis not present

## 2021-08-13 DIAGNOSIS — I83813 Varicose veins of bilateral lower extremities with pain: Secondary | ICD-10-CM

## 2021-08-13 DIAGNOSIS — Z1231 Encounter for screening mammogram for malignant neoplasm of breast: Secondary | ICD-10-CM

## 2021-08-13 LAB — CBC WITH DIFFERENTIAL/PLATELET
Basophils Absolute: 0 10*3/uL (ref 0.0–0.1)
Basophils Relative: 0.8 % (ref 0.0–3.0)
Eosinophils Absolute: 0.1 10*3/uL (ref 0.0–0.7)
Eosinophils Relative: 1.8 % (ref 0.0–5.0)
HCT: 39 % (ref 36.0–46.0)
Hemoglobin: 13.1 g/dL (ref 12.0–15.0)
Lymphocytes Relative: 42.4 % (ref 12.0–46.0)
Lymphs Abs: 2 10*3/uL (ref 0.7–4.0)
MCHC: 33.5 g/dL (ref 30.0–36.0)
MCV: 91.9 fl (ref 78.0–100.0)
Monocytes Absolute: 0.2 10*3/uL (ref 0.1–1.0)
Monocytes Relative: 5.3 % (ref 3.0–12.0)
Neutro Abs: 2.3 10*3/uL (ref 1.4–7.7)
Neutrophils Relative %: 49.7 % (ref 43.0–77.0)
Platelets: 121 10*3/uL — ABNORMAL LOW (ref 150.0–400.0)
RBC: 4.24 Mil/uL (ref 3.87–5.11)
RDW: 13.9 % (ref 11.5–15.5)
WBC: 4.7 10*3/uL (ref 4.0–10.5)

## 2021-08-13 LAB — COMPREHENSIVE METABOLIC PANEL
ALT: 40 U/L — ABNORMAL HIGH (ref 0–35)
AST: 43 U/L — ABNORMAL HIGH (ref 0–37)
Albumin: 3.8 g/dL (ref 3.5–5.2)
Alkaline Phosphatase: 93 U/L (ref 39–117)
BUN: 37 mg/dL — ABNORMAL HIGH (ref 6–23)
CO2: 28 mEq/L (ref 19–32)
Calcium: 9 mg/dL (ref 8.4–10.5)
Chloride: 103 mEq/L (ref 96–112)
Creatinine, Ser: 0.78 mg/dL (ref 0.40–1.20)
GFR: 82.13 mL/min (ref >60.00)
Glucose, Bld: 77 mg/dL (ref 70–99)
Potassium: 3.9 mEq/L (ref 3.5–5.1)
Sodium: 141 mEq/L (ref 135–145)
Total Bilirubin: 0.4 mg/dL (ref 0.2–1.2)
Total Protein: 6 g/dL (ref 6.0–8.3)

## 2021-08-13 LAB — TSH: TSH: 2.05 u[IU]/mL (ref 0.35–5.50)

## 2021-08-13 LAB — CK: Total CK: 286 U/L — ABNORMAL HIGH (ref 7–177)

## 2021-08-13 NOTE — Assessment & Plan Note (Signed)
continue annual checks of thyroid function

## 2021-08-13 NOTE — Telephone Encounter (Signed)
LVM for pt TCB and advise of what insurance she currently has. Friday seems inactive. I was told that it would end on 8.31.23. I am needing to obtain a PA for laser ablation. If pt calls back, please get current insurance.

## 2021-08-13 NOTE — Patient Instructions (Addendum)
1) Your platelets have been slightly low for the past few years.  As long as they don't dip below 100K,  no need to see hematology  2)  EYE EXAM  DUE  3) Your annual mammogram has been ordered AND IS DUE   THIS MONTH.  [facility] will not allow [place] to schedule it for you,  so please  call to make your appointment [phone]    4) PAP SMEAR DUE NEXT YEAR   5) DILL PICKLE JUICE (1 OUNCE) PREVENTS MUSCLE CRAMPS!

## 2021-08-13 NOTE — Progress Notes (Signed)
The patient is here for annual preventive examination and management of other chronic and acute problems.   The risk factors are reflected in the social history.   The roster of all physicians providing medical care to patient - is listed in the Snapshot section of the chart.   Activities of daily living:  The patient is 100% independent in all ADLs: dressing, toileting, feeding as well as independent mobility   Home safety : The patient has smoke detectors in the home. They wear seatbelts.  There are no unsecured firearms at home. There is no violence in the home.    There is no risks for hepatitis, STDs or HIV. There is no   history of blood transfusion. They have no travel history to infectious disease endemic areas of the world.   The patient has seen their dentist in the last six month. They have seen their eye doctor in the last year. The patinet  denies slight hearing difficulty with regard to whispered voices and some television programs.  They have deferred audiologic testing in the last year.  They do not  have excessive sun exposure. Discussed the need for sun protection: hats, long sleeves and use of sunscreen if there is significant sun exposure.    Diet: the importance of a healthy diet is discussed. They do have a healthy diet.   The benefits of regular aerobic exercise were discussed. The patient  exercises  3 to 5 days per week  for  60 minutes.    Depression screen: there are no signs or vegative symptoms of depression- irritability, change in appetite, anhedonia, sadness/tearfullness.   The following portions of the patient's history were reviewed and updated as appropriate: allergies, current medications, past family history, past medical history,  past surgical history, past social history  and problem list.   Visual acuity was not assessed per patient preference since the patient has regular follow up with an  ophthalmologist. Hearing and body mass index were assessed and  reviewed.    During the course of the visit the patient was educated and counseled about appropriate screening and preventive services including : fall prevention , diabetes screening, nutrition counseling, colorectal cancer screening, and recommended immunizations.    Chief Complaint:  HPI     Annual Exam    Additional comments: Denies any concerns or pain      Last edited by Neva Seat, Karna Dupes, CMA on 08/13/2021  1:33 PM.        Review of Symptoms  Patient denies headache, fevers, malaise, unintentional weight loss, skin rash, eye pain, sinus congestion and sinus pain, sore throat, dysphagia,  hemoptysis , cough, dyspnea, wheezing, chest pain, palpitations, orthopnea, edema, abdominal pain, nausea, melena, diarrhea, constipation, flank pain, dysuria, hematuria, urinary  Frequency, nocturia, numbness, tingling, seizures,  Focal weakness, Loss of consciousness,  Tremor, insomnia, depression, anxiety, and suicidal ideation.    Physical Exam:  BP 104/64 (BP Location: Left Arm, Patient Position: Sitting, Cuff Size: Normal)   Pulse 60   Temp 97.9 F (36.6 C) (Oral)   Resp 14   Ht '5\' 4"'$  (1.626 m)   Wt 130 lb 6.4 oz (59.1 kg)   LMP 08/13/2010   SpO2 97%   BMI 22.38 kg/m    General appearance: alert, cooperative and appears stated age Head: Normocephalic, without obvious abnormality, atraumatic Eyes: conjunctivae/corneas clear. PERRL, EOM's intact. Fundi benign. Ears: normal TM's and external ear canals both ears Nose: Nares normal. Septum midline. Mucosa normal. No drainage  or sinus tenderness. Throat: lips, mucosa, and tongue normal; teeth and gums normal Neck: no adenopathy, no carotid bruit, no JVD, supple, symmetrical, trachea midline and thyroid not enlarged, symmetric, no tenderness/mass/nodules Lungs: clear to auscultation bilaterally Breasts: normal appearance, no masses or tenderness Heart: regular rate and rhythm, S1, S2 normal, no murmur, click, rub or  gallop Abdomen: soft, non-tender; bowel sounds normal; no masses,  no organomegaly Extremities: extremities normal, atraumatic, no cyanosis or edema Pulses: 2+ and symmetric Skin: Skin color, texture, turgor normal. No rashes or lesions Neurologic: Alert and oriented X 3, normal strength and tone. Normal symmetric reflexes. Normal coordination and gait.     Assessment and Plan:  Varicose veins of leg with pain, bilateral Greater saphenous vein on the right to be ablated this fall .   Encounter for preventive health examination age appropriate education and counseling updated, referrals for preventative services and immunizations addressed, dietary and smoking counseling addressed, most recent labs reviewed.  I have personally reviewed and have noted:   1) the patient's medical and social history 2) The pt's use of alcohol, tobacco, and illicit drugs 3) The patient's current medications and supplements 4) Functional ability including ADL's, fall risk, home safety risk, hearing and visual impairment 5) Diet and physical activities 6) Evidence for depression or mood disorder 7) The patient's height, weight, and BMI have been recorded in the chart  I have made referrals, and provided counseling and education based on review of the above  Thrombocytopenia (HCC) Persistent , mild,  Repeat annually.  Workup has historically been deferred   History of Hashimoto thyroiditis continue annual checks of thyroid function   Elevated liver enzymes Chronic,  Suspect due to muscle enzyme elevation    Updated Medication List Outpatient Encounter Medications as of 08/13/2021  Medication Sig   Ascorbic Acid (VITAMIN C) 1000 MG tablet Take 1,000 mg by mouth 4 (four) times daily.     b complex vitamins tablet Take 1 tablet by mouth 2 (two) times daily.     cholecalciferol (VITAMIN D) 1000 UNITS tablet Take 1,000 Units by mouth daily.     Glucosamine-Chondroit-Vit C-Mn (GLUCOSAMINE 1500 COMPLEX PO)  Take by mouth 2 (two) times daily.     GLUTAMINE PO Take by mouth 3 (three) times daily.     Multiple Vitamins-Minerals (MULTIVITAMIN PO) Take by mouth daily.     Omega-3 Fatty Acids (FISH OIL MAXIMUM STRENGTH) 1200 MG CPDR Take by mouth 2 (two) times daily.     No facility-administered encounter medications on file as of 08/13/2021.

## 2021-08-13 NOTE — Assessment & Plan Note (Signed)
Chronic,  Suspect due to muscle enzyme elevation

## 2021-08-13 NOTE — Assessment & Plan Note (Signed)

## 2021-08-13 NOTE — Assessment & Plan Note (Signed)
Greater saphenous vein on the right to be ablated this fall .

## 2021-08-13 NOTE — Assessment & Plan Note (Signed)
Persistent , mild,  Repeat annually.  Workup has historically been deferred

## 2021-08-14 ENCOUNTER — Telehealth (INDEPENDENT_AMBULATORY_CARE_PROVIDER_SITE_OTHER): Payer: Self-pay | Admitting: Vascular Surgery

## 2021-08-14 NOTE — Telephone Encounter (Signed)
LVM for pt TCB and schedule appts:  right leg laser. see jd. auth # [id number]. exp: 9.5.23 - 2.3.24  1 week post laser  4 week post laser. See jd.

## 2021-08-19 DIAGNOSIS — R69 Illness, unspecified: Secondary | ICD-10-CM | POA: Diagnosis not present

## 2021-08-19 DIAGNOSIS — Z630 Problems in relationship with spouse or partner: Secondary | ICD-10-CM | POA: Diagnosis not present

## 2021-09-02 ENCOUNTER — Other Ambulatory Visit: Payer: Self-pay | Admitting: Internal Medicine

## 2021-09-02 DIAGNOSIS — Z630 Problems in relationship with spouse or partner: Secondary | ICD-10-CM | POA: Diagnosis not present

## 2021-09-02 DIAGNOSIS — Z1231 Encounter for screening mammogram for malignant neoplasm of breast: Secondary | ICD-10-CM

## 2021-09-02 DIAGNOSIS — R69 Illness, unspecified: Secondary | ICD-10-CM | POA: Diagnosis not present

## 2021-09-09 ENCOUNTER — Ambulatory Visit
Admission: RE | Admit: 2021-09-09 | Discharge: 2021-09-09 | Disposition: A | Discharge status: Home / Self Care | Payer: 59 | Source: Ambulatory Visit | Attending: Internal Medicine | Admitting: Internal Medicine

## 2021-09-09 DIAGNOSIS — M62830 Muscle spasm of back: Secondary | ICD-10-CM | POA: Diagnosis not present

## 2021-09-09 DIAGNOSIS — Z1231 Encounter for screening mammogram for malignant neoplasm of breast: Secondary | ICD-10-CM | POA: Insufficient documentation

## 2021-09-09 DIAGNOSIS — M9901 Segmental and somatic dysfunction of cervical region: Secondary | ICD-10-CM | POA: Diagnosis not present

## 2021-09-09 DIAGNOSIS — M5033 Other cervical disc degeneration, cervicothoracic region: Secondary | ICD-10-CM | POA: Diagnosis not present

## 2021-09-09 DIAGNOSIS — M9902 Segmental and somatic dysfunction of thoracic region: Secondary | ICD-10-CM | POA: Diagnosis not present

## 2021-09-30 DIAGNOSIS — M9903 Segmental and somatic dysfunction of lumbar region: Secondary | ICD-10-CM | POA: Diagnosis not present

## 2021-09-30 DIAGNOSIS — M9901 Segmental and somatic dysfunction of cervical region: Secondary | ICD-10-CM | POA: Diagnosis not present

## 2021-09-30 DIAGNOSIS — M542 Cervicalgia: Secondary | ICD-10-CM | POA: Diagnosis not present

## 2021-09-30 DIAGNOSIS — M9902 Segmental and somatic dysfunction of thoracic region: Secondary | ICD-10-CM | POA: Diagnosis not present

## 2021-09-30 DIAGNOSIS — S335XXA Sprain of ligaments of lumbar spine, initial encounter: Secondary | ICD-10-CM | POA: Diagnosis not present

## 2021-10-07 ENCOUNTER — Other Ambulatory Visit (INDEPENDENT_AMBULATORY_CARE_PROVIDER_SITE_OTHER): Payer: Self-pay | Admitting: Vascular Surgery

## 2021-10-21 DIAGNOSIS — S335XXA Sprain of ligaments of lumbar spine, initial encounter: Secondary | ICD-10-CM | POA: Diagnosis not present

## 2021-10-21 DIAGNOSIS — M9902 Segmental and somatic dysfunction of thoracic region: Secondary | ICD-10-CM | POA: Diagnosis not present

## 2021-10-21 DIAGNOSIS — M9901 Segmental and somatic dysfunction of cervical region: Secondary | ICD-10-CM | POA: Diagnosis not present

## 2021-10-21 DIAGNOSIS — M542 Cervicalgia: Secondary | ICD-10-CM | POA: Diagnosis not present

## 2021-10-21 DIAGNOSIS — M9903 Segmental and somatic dysfunction of lumbar region: Secondary | ICD-10-CM | POA: Diagnosis not present

## 2021-11-05 DIAGNOSIS — M542 Cervicalgia: Secondary | ICD-10-CM | POA: Diagnosis not present

## 2021-11-05 DIAGNOSIS — M9901 Segmental and somatic dysfunction of cervical region: Secondary | ICD-10-CM | POA: Diagnosis not present

## 2021-11-05 DIAGNOSIS — M9902 Segmental and somatic dysfunction of thoracic region: Secondary | ICD-10-CM | POA: Diagnosis not present

## 2021-11-05 DIAGNOSIS — M9903 Segmental and somatic dysfunction of lumbar region: Secondary | ICD-10-CM | POA: Diagnosis not present

## 2021-11-05 DIAGNOSIS — S335XXA Sprain of ligaments of lumbar spine, initial encounter: Secondary | ICD-10-CM | POA: Diagnosis not present

## 2021-11-10 ENCOUNTER — Encounter (INDEPENDENT_AMBULATORY_CARE_PROVIDER_SITE_OTHER): Payer: Self-pay

## 2021-11-11 ENCOUNTER — Ambulatory Visit (INDEPENDENT_AMBULATORY_CARE_PROVIDER_SITE_OTHER): Payer: 59 | Admitting: Vascular Surgery

## 2021-11-11 ENCOUNTER — Encounter (INDEPENDENT_AMBULATORY_CARE_PROVIDER_SITE_OTHER): Payer: Self-pay | Admitting: Vascular Surgery

## 2021-11-11 VITALS — BP 110/69 | HR 59 | Resp 18 | Ht 64.0 in | Wt 130.6 lb

## 2021-11-11 DIAGNOSIS — I83813 Varicose veins of bilateral lower extremities with pain: Secondary | ICD-10-CM | POA: Diagnosis not present

## 2021-11-11 NOTE — Progress Notes (Signed)
  Crystal Erickson is a 61 y.o. female who presents with symptomatic venous reflux  No past medical history on file.  No past surgical history on file.   Current Outpatient Medications:    Ascorbic Acid (VITAMIN C) 1000 MG tablet, Take 1,000 mg by mouth 4 (four) times daily.  , Disp: , Rfl:    b complex vitamins tablet, Take 1 tablet by mouth 2 (two) times daily.  , Disp: , Rfl:    cholecalciferol (VITAMIN D) 1000 UNITS tablet, Take 1,000 Units by mouth daily.  , Disp: , Rfl:    Glucosamine-Chondroit-Vit C-Mn (GLUCOSAMINE 1500 COMPLEX PO), Take by mouth 2 (two) times daily.  , Disp: , Rfl:    GLUTAMINE PO, Take by mouth 3 (three) times daily.  , Disp: , Rfl:    Multiple Vitamins-Minerals (MULTIVITAMIN PO), Take by mouth daily.  , Disp: , Rfl:    Omega-3 Fatty Acids (FISH OIL MAXIMUM STRENGTH) 1200 MG CPDR, Take by mouth 2 (two) times daily.  , Disp: , Rfl:   No Known Allergies   Varicose veins of leg with pain, bilateral   PLAN: The patient's right lower extremity was sterilely prepped and draped. The ultrasound machine was used to visualize the saphenous vein throughout its course. A segment in the upper calf was selected for access. The saphenous vein was accessed without difficulty using ultrasound guidance with a micropuncture needle. A 0.018 wire was then placed beyond the saphenofemoral junction and the needle was removed. The 65 cm sheath was then placed over the wire and the wire and dilator were removed. The laser fiber was then placed through the sheath and its tip was placed approximately 4-5 centimeters below the saphenofemoral junction. Tumescent anesthesia was then created with a dilute lidocaine solution. Laser energy was then delivered with constant withdrawal of the sheath and laser fiber. Approximately 1468 joules of energy were delivered over a length of 33 centimeters using a 1470 Hz VenaCure machine at 7 W. Sterile dressings were placed. The patient tolerated the procedure  well without obvious complications.   Follow-up in 1 week with post-laser duplex.

## 2021-11-13 DIAGNOSIS — M542 Cervicalgia: Secondary | ICD-10-CM | POA: Diagnosis not present

## 2021-11-13 DIAGNOSIS — S335XXA Sprain of ligaments of lumbar spine, initial encounter: Secondary | ICD-10-CM | POA: Diagnosis not present

## 2021-11-13 DIAGNOSIS — M9903 Segmental and somatic dysfunction of lumbar region: Secondary | ICD-10-CM | POA: Diagnosis not present

## 2021-11-13 DIAGNOSIS — M9901 Segmental and somatic dysfunction of cervical region: Secondary | ICD-10-CM | POA: Diagnosis not present

## 2021-11-13 DIAGNOSIS — M9902 Segmental and somatic dysfunction of thoracic region: Secondary | ICD-10-CM | POA: Diagnosis not present

## 2021-11-18 ENCOUNTER — Encounter (INDEPENDENT_AMBULATORY_CARE_PROVIDER_SITE_OTHER): Payer: Self-pay

## 2021-11-20 ENCOUNTER — Encounter (INDEPENDENT_AMBULATORY_CARE_PROVIDER_SITE_OTHER): Payer: Self-pay

## 2021-11-25 ENCOUNTER — Ambulatory Visit (INDEPENDENT_AMBULATORY_CARE_PROVIDER_SITE_OTHER): Payer: Self-pay

## 2021-11-25 DIAGNOSIS — I83813 Varicose veins of bilateral lower extremities with pain: Secondary | ICD-10-CM

## 2021-12-02 DIAGNOSIS — M9901 Segmental and somatic dysfunction of cervical region: Secondary | ICD-10-CM | POA: Diagnosis not present

## 2021-12-02 DIAGNOSIS — M9902 Segmental and somatic dysfunction of thoracic region: Secondary | ICD-10-CM | POA: Diagnosis not present

## 2021-12-02 DIAGNOSIS — S335XXA Sprain of ligaments of lumbar spine, initial encounter: Secondary | ICD-10-CM | POA: Diagnosis not present

## 2021-12-02 DIAGNOSIS — M9903 Segmental and somatic dysfunction of lumbar region: Secondary | ICD-10-CM | POA: Diagnosis not present

## 2021-12-02 DIAGNOSIS — M542 Cervicalgia: Secondary | ICD-10-CM | POA: Diagnosis not present

## 2021-12-09 ENCOUNTER — Encounter (INDEPENDENT_AMBULATORY_CARE_PROVIDER_SITE_OTHER): Payer: Self-pay | Admitting: Vascular Surgery

## 2021-12-09 ENCOUNTER — Ambulatory Visit (INDEPENDENT_AMBULATORY_CARE_PROVIDER_SITE_OTHER): Payer: 59 | Admitting: Vascular Surgery

## 2021-12-09 VITALS — BP 127/77 | HR 68 | Resp 16 | Ht 64.0 in | Wt 131.0 lb

## 2021-12-09 DIAGNOSIS — I83813 Varicose veins of bilateral lower extremities with pain: Secondary | ICD-10-CM

## 2021-12-09 DIAGNOSIS — S335XXA Sprain of ligaments of lumbar spine, initial encounter: Secondary | ICD-10-CM | POA: Diagnosis not present

## 2021-12-09 DIAGNOSIS — M542 Cervicalgia: Secondary | ICD-10-CM | POA: Diagnosis not present

## 2021-12-09 DIAGNOSIS — M9901 Segmental and somatic dysfunction of cervical region: Secondary | ICD-10-CM | POA: Diagnosis not present

## 2021-12-09 DIAGNOSIS — M9902 Segmental and somatic dysfunction of thoracic region: Secondary | ICD-10-CM | POA: Diagnosis not present

## 2021-12-09 DIAGNOSIS — M9903 Segmental and somatic dysfunction of lumbar region: Secondary | ICD-10-CM | POA: Diagnosis not present

## 2021-12-09 NOTE — Patient Instructions (Signed)
Sclerotherapy, Care After After sclerotherapy, it is common to have swelling, bruising, and soreness. You may also have: Some changes to skin color. Slight bleeding from where you got your shot (injection site). Follow these instructions at home: The instructions below may help you care for yourself at home. Your health care provider may give you more instructions. If you have questions, ask your health care provider. Injection site care  Follow instructions from your health care provider about how to take care of your injection site. Make sure you: Wash your hands with soap and water for at least 20 seconds before and after you change your bandage. If you cannot use soap and water, use hand sanitizer. Change your bandage. Check the area around any injection sites (injection areas) every day for signs of infection. Check for: More redness, swelling, or pain. More fluid or blood. Warmth. Pus or a bad smell. Activity Do light exercise every day, as told by your health care provider. Walking or riding a stationary bike may be good options for you. Return to your normal activities when your health care provider says that it is safe. Ask what activities are safe for you. General instructions  Take over-the-counter and prescription medicines only as told by your health care provider. Do not use lotions or creams on your legs unless your health care provider approves. Do not smoke or use any products that contain nicotine or tobacco before the procedure. If you need help quitting, ask your health care provider. Wear compression stockings as told by your health care provider. These help to prevent blood clots and reduce swelling in your legs. Wear loose-fitting clothes on the treatment area. Avoid being in direct sunlight. This includes avoiding: Sun tanning. Using tanning beds. Do not use hot, wet cloths or any form of heat near the injection site. Contact a health care provider if: You have  more redness, swelling, or pain at any injection area. You have more fluid or blood coming from any injection site. Any injection area feels warm to the touch. You have pus or a bad smell coming from any injection site. You have a fever. Get help right away if: You have leg pain that gets worse when you walk. You have redness or swelling in your leg that is getting worse. You have trouble breathing. You have chest pain. Summary Swelling, bruising, and soreness are common after this procedure. Check all injection areas every day for signs of infection. Wear compression stockings as told by your health care provider. These stockings help to prevent blood clots and reduce swelling in your legs. This information is not intended to replace advice given to you by your health care provider. Make sure you discuss any questions you have with your health care provider. Document Revised: 04/03/2021 Document Reviewed: 04/03/2021 Elsevier Patient Education  [place].

## 2021-12-09 NOTE — Progress Notes (Signed)
MRN : [id number]  Crystal Erickson is a 61 y.o. (04-Mar-1960) female who presents with chief complaint of  Chief Complaint  Patient presents with   Follow-up    Post laser  .  History of Present Illness: Patient returns today in follow up of her venous disease.  She is about a month status post right great saphenous vein laser ablation for symptomatic reflux.  Her leg does feel better.  The bruising, soreness, and firm induration overlying the saphenous vein have largely improved.  She is still bothered by painful residual varicosities particularly in the medial calf and ankle area.  They have decreased some in size, but they remain prominent and painful with itching and burning and stinging on a daily basis.  Her postprocedural duplex showed a successful ablation without DVT.  Current Outpatient Medications  Medication Sig Dispense Refill   Ascorbic Acid (VITAMIN C) 1000 MG tablet Take 1,000 mg by mouth 4 (four) times daily.       b complex vitamins tablet Take 1 tablet by mouth 2 (two) times daily.       cholecalciferol (VITAMIN D) 1000 UNITS tablet Take 1,000 Units by mouth daily.       Glucosamine-Chondroit-Vit C-Mn (GLUCOSAMINE 1500 COMPLEX PO) Take by mouth 2 (two) times daily.       GLUTAMINE PO Take by mouth 3 (three) times daily.       Multiple Vitamins-Minerals (MULTIVITAMIN PO) Take by mouth daily.       Omega-3 Fatty Acids (FISH OIL MAXIMUM STRENGTH) 1200 MG CPDR Take by mouth 2 (two) times daily.       No current facility-administered medications for this visit.    No past medical history on file.  No past surgical history on file.   Social History   Tobacco Use   Smoking status: Never   Smokeless tobacco: Never  Substance Use Topics   Alcohol use: Yes    Alcohol/week: 0.0 standard drinks of alcohol   Drug use: No      Family History  Problem Relation Age of Onset   Mental illness Mother 21       alzheimer's   Cancer Mother        metastatic unknown primary    Diabetes Father    Breast cancer Maternal Aunt      No Known Allergies   REVIEW OF SYSTEMS (Negative unless checked)  Constitutional: '[]'$ Weight loss  '[]'$ Fever  '[]'$ Chills Cardiac: '[]'$ Chest pain   '[]'$ Chest pressure   '[]'$ Palpitations   '[]'$ Shortness of breath when laying flat   '[]'$ Shortness of breath at rest   '[]'$ Shortness of breath with exertion. Vascular:  '[]'$ Pain in legs with walking   '[]'$ Pain in legs at rest   '[]'$ Pain in legs when laying flat   '[]'$ Claudication   '[]'$ Pain in feet when walking  '[]'$ Pain in feet at rest  '[]'$ Pain in feet when laying flat   '[]'$ History of DVT   '[]'$ Phlebitis   '[]'$ Swelling in legs   '[x]'$ Varicose veins   '[]'$ Non-healing ulcers Pulmonary:   '[]'$ Uses home oxygen   '[]'$ Productive cough   '[]'$ Hemoptysis   '[]'$ Wheeze  '[]'$ COPD   '[]'$ Asthma Neurologic:  '[]'$ Dizziness  '[]'$ Blackouts   '[]'$ Seizures   '[]'$ History of stroke   '[]'$ History of TIA  '[]'$ Aphasia   '[]'$ Temporary blindness   '[]'$ Dysphagia   '[]'$ Weakness or numbness in arms   '[]'$ Weakness or numbness in legs Musculoskeletal:  '[]'$ Arthritis   '[]'$ Joint swelling   '[]'$ Joint pain   '[]'$ Low back pain Hematologic:  '[]'$ Easy  bruising  '[]'$ Easy bleeding   '[]'$ Hypercoagulable state   '[]'$ Anemic   Gastrointestinal:  '[]'$ Blood in stool   '[]'$ Vomiting blood  '[]'$ Gastroesophageal reflux/heartburn   '[]'$ Abdominal pain Genitourinary:  '[]'$ Chronic kidney disease   '[]'$ Difficult urination  '[]'$ Frequent urination  '[]'$ Burning with urination   '[]'$ Hematuria Skin:  '[]'$ Rashes   '[]'$ Ulcers   '[]'$ Wounds Psychological:  '[]'$ History of anxiety   '[]'$  History of major depression.  Physical Examination  BP 127/77 (BP Location: Right Arm)   Pulse 68   Resp 16   Ht '5\' 4"'$  (1.626 m)   Wt 131 lb (59.4 kg)   LMP 08/13/2010   BMI 22.49 kg/m  Gen:  WD/WN, NAD. Appears younger than stated age. Head: [place]/AT, No temporalis wasting. Ear/Nose/Throat: Hearing grossly intact, nares w/o erythema or drainage Eyes: Conjunctiva clear. Sclera non-icteric Neck: Supple.  Trachea midline Pulmonary:  Good air movement, no use of accessory muscles.   Cardiac: RRR, no JVD Vascular:  Vessel Right Left  Radial Palpable Palpable               Musculoskeletal: M/S 5/5 throughout.  No deformity or atrophy.  Fairly prominent varicosities in the right medial calf and ankle area up to about 2 mm.  No significant lower extremity edema. Neurologic: Sensation grossly intact in extremities.  Symmetrical.  Speech is fluent.  Psychiatric: Judgment intact, Mood & affect appropriate for pt's clinical situation. Dermatologic: No rashes or ulcers noted.  No cellulitis or open wounds.      Labs No results found for this or any previous visit (from the past 2160 hour(s)).  Radiology VAS [place] LASER ABLATION OF SUPERFICIAL VEIN  Result Date: 12/01/2021  Lower Venous DVT Study Patient Name:  Crystal Erickson  Date of Exam:   11/25/2021 Medical Rec #: [id number]       Accession #:    [id number] Date of Birth: [birth year]        Patient Gender: F Patient Age:   89 years Exam Location:  [facility] Procedure:      VAS [place] LOWER EXTREMITY VENOUS (DVT) Referring Phys: Corene Cornea Tamas Suen --------------------------------------------------------------------------------  Indications: Pain, and Swelling.  Risk Factors: Surgery Right GSV ablation 11/11/2021. Performing Technologist: Delorise Shiner RVT  Examination Guidelines: A complete evaluation includes B-mode imaging, spectral Doppler, color Doppler, and power Doppler as needed of all accessible portions of each vessel. Bilateral testing is considered an integral part of a complete examination. Limited examinations for reoccurring indications may be performed as noted. The reflux portion of the exam is performed with the patient in reverse Trendelenburg.  Post Ablation: Follow up evaluation status post ablation of right Great Saphenous Vein on 11/11/2021.  +---------+---------------+---------+-----------+----------+--------------+ RIGHT    CompressibilityPhasicitySpontaneityPropertiesThrombus Aging  +---------+---------------+---------+-----------+----------+--------------+ CFV      Full                                                        +---------+---------------+---------+-----------+----------+--------------+ SFJ      Full                                                        +---------+---------------+---------+-----------+----------+--------------+ FV Prox  Full                                                        +---------+---------------+---------+-----------+----------+--------------+  FV Mid   Full                                                        +---------+---------------+---------+-----------+----------+--------------+ FV DistalFull                                                        +---------+---------------+---------+-----------+----------+--------------+ GSV      None           No       No         dilated                  +---------+---------------+---------+-----------+----------+--------------+     Summary: RIGHT: - Successful ablation of the right great saphenous vein.   Post Ablation Summary: - Successful ablation of the right Great Saphenous Vein from the Proximal calf to 2.3 cm from the sapheno-femoral junction.  *See table(s) above for measurements and observations. Electronically signed by Leotis Pain MD on 12/01/2021 at 1:02:12 PM.    Final     Assessment/Plan  Varicose veins of leg with pain, bilateral The patient is undergone a successful laser ablation of the right great saphenous vein but had symptomatic varicosities that are residual causing pain on a daily basis.  Sclerotherapy and foam sclerotherapy can be performed for these residual varicosities.  Risks and benefits were discussed and she desires to proceed.    Leotis Pain, MD  12/09/2021 1:11 PM    This note was created with Dragon medical transcription system.  Any errors from dictation are purely unintentional

## 2021-12-09 NOTE — Assessment & Plan Note (Signed)
The patient is undergone a successful laser ablation of the right great saphenous vein but had symptomatic varicosities that are residual causing pain on a daily basis.  Sclerotherapy and foam sclerotherapy can be performed for these residual varicosities.  Risks and benefits were discussed and she desires to proceed.

## 2021-12-16 DIAGNOSIS — M9903 Segmental and somatic dysfunction of lumbar region: Secondary | ICD-10-CM | POA: Diagnosis not present

## 2021-12-16 DIAGNOSIS — M9901 Segmental and somatic dysfunction of cervical region: Secondary | ICD-10-CM | POA: Diagnosis not present

## 2021-12-16 DIAGNOSIS — S335XXA Sprain of ligaments of lumbar spine, initial encounter: Secondary | ICD-10-CM | POA: Diagnosis not present

## 2021-12-16 DIAGNOSIS — M542 Cervicalgia: Secondary | ICD-10-CM | POA: Diagnosis not present

## 2021-12-16 DIAGNOSIS — M9902 Segmental and somatic dysfunction of thoracic region: Secondary | ICD-10-CM | POA: Diagnosis not present

## 2021-12-16 DIAGNOSIS — M722 Plantar fascial fibromatosis: Secondary | ICD-10-CM | POA: Diagnosis not present

## 2022-01-20 DIAGNOSIS — M9902 Segmental and somatic dysfunction of thoracic region: Secondary | ICD-10-CM | POA: Diagnosis not present

## 2022-01-20 DIAGNOSIS — M9901 Segmental and somatic dysfunction of cervical region: Secondary | ICD-10-CM | POA: Diagnosis not present

## 2022-01-20 DIAGNOSIS — S335XXA Sprain of ligaments of lumbar spine, initial encounter: Secondary | ICD-10-CM | POA: Diagnosis not present

## 2022-01-20 DIAGNOSIS — M722 Plantar fascial fibromatosis: Secondary | ICD-10-CM | POA: Diagnosis not present

## 2022-01-20 DIAGNOSIS — M9903 Segmental and somatic dysfunction of lumbar region: Secondary | ICD-10-CM | POA: Diagnosis not present

## 2022-01-20 DIAGNOSIS — M542 Cervicalgia: Secondary | ICD-10-CM | POA: Diagnosis not present

## 2022-02-17 DIAGNOSIS — M9902 Segmental and somatic dysfunction of thoracic region: Secondary | ICD-10-CM | POA: Diagnosis not present

## 2022-02-17 DIAGNOSIS — M542 Cervicalgia: Secondary | ICD-10-CM | POA: Diagnosis not present

## 2022-02-17 DIAGNOSIS — M722 Plantar fascial fibromatosis: Secondary | ICD-10-CM | POA: Diagnosis not present

## 2022-02-17 DIAGNOSIS — M9903 Segmental and somatic dysfunction of lumbar region: Secondary | ICD-10-CM | POA: Diagnosis not present

## 2022-02-17 DIAGNOSIS — M9901 Segmental and somatic dysfunction of cervical region: Secondary | ICD-10-CM | POA: Diagnosis not present

## 2022-03-26 DIAGNOSIS — M542 Cervicalgia: Secondary | ICD-10-CM | POA: Diagnosis not present

## 2022-03-26 DIAGNOSIS — M9901 Segmental and somatic dysfunction of cervical region: Secondary | ICD-10-CM | POA: Diagnosis not present

## 2022-03-26 DIAGNOSIS — M9903 Segmental and somatic dysfunction of lumbar region: Secondary | ICD-10-CM | POA: Diagnosis not present

## 2022-03-26 DIAGNOSIS — M9902 Segmental and somatic dysfunction of thoracic region: Secondary | ICD-10-CM | POA: Diagnosis not present

## 2022-03-26 DIAGNOSIS — M722 Plantar fascial fibromatosis: Secondary | ICD-10-CM | POA: Diagnosis not present

## 2022-04-23 DIAGNOSIS — M542 Cervicalgia: Secondary | ICD-10-CM | POA: Diagnosis not present

## 2022-04-23 DIAGNOSIS — M9902 Segmental and somatic dysfunction of thoracic region: Secondary | ICD-10-CM | POA: Diagnosis not present

## 2022-04-23 DIAGNOSIS — M722 Plantar fascial fibromatosis: Secondary | ICD-10-CM | POA: Diagnosis not present

## 2022-04-23 DIAGNOSIS — M9903 Segmental and somatic dysfunction of lumbar region: Secondary | ICD-10-CM | POA: Diagnosis not present

## 2022-04-23 DIAGNOSIS — M9901 Segmental and somatic dysfunction of cervical region: Secondary | ICD-10-CM | POA: Diagnosis not present

## 2022-04-30 DIAGNOSIS — M9903 Segmental and somatic dysfunction of lumbar region: Secondary | ICD-10-CM | POA: Diagnosis not present

## 2022-04-30 DIAGNOSIS — M542 Cervicalgia: Secondary | ICD-10-CM | POA: Diagnosis not present

## 2022-04-30 DIAGNOSIS — M722 Plantar fascial fibromatosis: Secondary | ICD-10-CM | POA: Diagnosis not present

## 2022-04-30 DIAGNOSIS — M9901 Segmental and somatic dysfunction of cervical region: Secondary | ICD-10-CM | POA: Diagnosis not present

## 2022-04-30 DIAGNOSIS — M9902 Segmental and somatic dysfunction of thoracic region: Secondary | ICD-10-CM | POA: Diagnosis not present

## 2022-05-25 DIAGNOSIS — M9903 Segmental and somatic dysfunction of lumbar region: Secondary | ICD-10-CM | POA: Diagnosis not present

## 2022-05-25 DIAGNOSIS — M9901 Segmental and somatic dysfunction of cervical region: Secondary | ICD-10-CM | POA: Diagnosis not present

## 2022-05-25 DIAGNOSIS — M9902 Segmental and somatic dysfunction of thoracic region: Secondary | ICD-10-CM | POA: Diagnosis not present

## 2022-05-25 DIAGNOSIS — M722 Plantar fascial fibromatosis: Secondary | ICD-10-CM | POA: Diagnosis not present

## 2022-05-25 DIAGNOSIS — M542 Cervicalgia: Secondary | ICD-10-CM | POA: Diagnosis not present

## 2022-06-19 ENCOUNTER — Telehealth: Payer: Self-pay | Admitting: Internal Medicine

## 2022-06-19 ENCOUNTER — Encounter: Payer: Self-pay | Admitting: Nurse Practitioner

## 2022-06-19 ENCOUNTER — Other Ambulatory Visit: Payer: Self-pay | Admitting: Nurse Practitioner

## 2022-06-19 ENCOUNTER — Ambulatory Visit (INDEPENDENT_AMBULATORY_CARE_PROVIDER_SITE_OTHER): Payer: 59 | Admitting: Nurse Practitioner

## 2022-06-19 VITALS — BP 120/84 | HR 78 | Temp 97.8°F | Ht 64.0 in | Wt 128.8 lb

## 2022-06-19 DIAGNOSIS — R399 Unspecified symptoms and signs involving the genitourinary system: Secondary | ICD-10-CM | POA: Diagnosis not present

## 2022-06-19 LAB — URINALYSIS, ROUTINE W REFLEX MICROSCOPIC
Bilirubin Urine: NEGATIVE
Ketones, ur: NEGATIVE
Nitrite: NEGATIVE
Specific Gravity, Urine: <=1.005 — AB (ref 1.000–1.030)
Total Protein, Urine: NEGATIVE
Urine Glucose: NEGATIVE
Urobilinogen, UA: 0.2 (ref 0.0–1.0)
pH: 6 (ref 5.0–8.0)

## 2022-06-19 LAB — POCT URINALYSIS DIPSTICK
Bilirubin, UA: NEGATIVE
Glucose, UA: NEGATIVE
Ketones, UA: NEGATIVE
Nitrite, UA: NEGATIVE
Protein, UA: NEGATIVE
Spec Grav, UA: 1.025 (ref 1.010–1.025)
Urobilinogen, UA: 0.2 E.U./dL
pH, UA: 6 (ref 5.0–8.0)

## 2022-06-19 MED ORDER — CEPHALEXIN 500 MG PO CAPS: 500.0000 mg | ORAL_CAPSULE | Freq: Two times a day (BID) | ORAL | 0 refills | Status: DC

## 2022-06-19 MED ORDER — CEPHALEXIN 500 MG PO CAPS: 500.0000 mg | ORAL_CAPSULE | Freq: Two times a day (BID) | ORAL | 0 refills | Status: AC

## 2022-06-19 NOTE — Patient Instructions (Addendum)
Rx sent to pharmacy. Will update you with the culture results and will change antibiotic if needed.  Urinary Tract Infection, Adult  A urinary tract infection (UTI) is an infection of any part of the urinary tract. The urinary tract includes the kidneys, ureters, bladder, and urethra. These organs make, store, and get rid of urine in the body. An upper UTI affects the ureters and kidneys. A lower UTI affects the bladder and urethra. What are the causes? Most urinary tract infections are caused by bacteria in your genital area around your urethra, where urine leaves your body. These bacteria grow and cause inflammation of your urinary tract. What increases the risk? You are more likely to develop this condition if: You have a urinary catheter that stays in place. You are not able to control when you urinate or have a bowel movement (incontinence). You are female and you: Use a spermicide or diaphragm for birth control. Have low estrogen levels. Are pregnant. You have certain genes that increase your risk. You are sexually active. You take antibiotic medicines. You have a condition that causes your flow of urine to slow down, such as: An enlarged prostate, if you are female. Blockage in your urethra. A kidney stone. A nerve condition that affects your bladder control (neurogenic bladder). Not getting enough to drink, or not urinating often. You have certain medical conditions, such as: Diabetes. A weak disease-fighting system (immunesystem). Sickle cell disease. Gout. Spinal cord injury. What are the signs or symptoms? Symptoms of this condition include: Needing to urinate right away (urgency). Frequent urination. This may include small amounts of urine each time you urinate. Pain or burning with urination. Blood in the urine. Urine that smells bad or unusual. Trouble urinating. Cloudy urine. Vaginal discharge, if you are female. Pain in the abdomen or the lower back. You may  also have: Vomiting or a decreased appetite. Confusion. Irritability or tiredness. A fever or chills. Diarrhea. The first symptom in older adults may be confusion. In some cases, they may not have any symptoms until the infection has worsened. How is this diagnosed? This condition is diagnosed based on your medical history and a physical exam. You may also have other tests, including: Urine tests. Blood tests. Tests for STIs (sexually transmitted infections). If you have had more than one UTI, a cystoscopy or imaging studies may be done to determine the cause of the infections. How is this treated? Treatment for this condition includes: Antibiotic medicine. Over-the-counter medicines to treat discomfort. Drinking enough water to stay hydrated. If you have frequent infections or have other conditions such as a kidney stone, you may need to see a health care provider who specializes in the urinary tract (urologist). In rare cases, urinary tract infections can cause sepsis. Sepsis is a life-threatening condition that occurs when the body responds to an infection. Sepsis is treated in the hospital with IV antibiotics, fluids, and other medicines. Follow these instructions at home:  Medicines Take over-the-counter and prescription medicines only as told by your health care provider. If you were prescribed an antibiotic medicine, take it as told by your health care provider. Do not stop using the antibiotic even if you start to feel better. General instructions Make sure you: Empty your bladder often and completely. Do not hold urine for long periods of time. Empty your bladder after sex. Wipe from front to back after urinating or having a bowel movement if you are female. Use each tissue only one time when you wipe. Drink  enough fluid to keep your urine pale yellow. Keep all follow-up visits. This is important. Contact a health care provider if: Your symptoms do not get better after 1-2  days. Your symptoms go away and then return. Get help right away if: You have severe pain in your back or your lower abdomen. You have a fever or chills. You have nausea or vomiting. Summary A urinary tract infection (UTI) is an infection of any part of the urinary tract, which includes the kidneys, ureters, bladder, and urethra. Most urinary tract infections are caused by bacteria in your genital area. Treatment for this condition often includes antibiotic medicines. If you were prescribed an antibiotic medicine, take it as told by your health care provider. Do not stop using the antibiotic even if you start to feel better. Keep all follow-up visits. This is important. This information is not intended to replace advice given to you by your health care provider. Make sure you discuss any questions you have with your health care provider. Document Revised: 08/06/2019 Document Reviewed: 08/11/2019 Elsevier Patient Education  2024 [employer]. .

## 2022-06-19 NOTE — Telephone Encounter (Signed)
Please inform the pt the medication has been sent to [facility] [address].

## 2022-06-19 NOTE — Progress Notes (Signed)
Established Patient Office Visit  Subjective:  Patient ID: Crystal Erickson, female    DOB: 1960-04-02  Age: 62 y.o. MRN: [id number]  CC:  Chief Complaint  Patient presents with   Urinary Tract Infection    HPI  Crystal Erickson presents for   Urinary Tract Infection  This is a new problem. The current episode started in the past 7 days. The problem occurs intermittently. The problem has been unchanged. Quality: no pain. The patient is experiencing no pain. There has been no fever. She is Sexually active. There is No history of pyelonephritis. Associated symptoms include frequency, hematuria and urgency. Pertinent negatives include no chills, discharge, flank pain or nausea. Treatments tried: OTO Azo. The treatment provided mild relief.     History reviewed. No pertinent past medical history.  History reviewed. No pertinent surgical history.  Family History  Problem Relation Age of Onset   Mental illness Mother 63       alzheimer's   Cancer Mother        metastatic unknown primary   Diabetes Father    Breast cancer Maternal Aunt     Social History   Socioeconomic History   Marital status: Single    Spouse name: Not on file   Number of children: Not on file   Years of education: Not on file   Highest education level: Not on file  Occupational History   Occupation: Systems analyst    Employer: [employer]  Tobacco Use   Smoking status: Never   Smokeless tobacco: Never  Substance and Sexual Activity   Alcohol use: Yes    Alcohol/week: 0.0 standard drinks of alcohol   Drug use: No   Sexual activity: Not on file  Other Topics Concern   Not on file  Social History Narrative   Not on file   Social Determinants of Health   Financial Resource Strain: Not on file  Food Insecurity: Not on file  Transportation Needs: Not on file  Physical Activity: Not on file  Stress: Not on file  Social Connections: Not on file  Intimate Partner Violence: Not on file      Outpatient Medications Prior to Visit  Medication Sig Dispense Refill   Ascorbic Acid (VITAMIN C) 1000 MG tablet Take 1,000 mg by mouth 4 (four) times daily.       b complex vitamins tablet Take 1 tablet by mouth 2 (two) times daily.       cholecalciferol (VITAMIN D) 1000 UNITS tablet Take 1,000 Units by mouth daily.       Glucosamine-Chondroit-Vit C-Mn (GLUCOSAMINE 1500 COMPLEX PO) Take by mouth 2 (two) times daily.       GLUTAMINE PO Take by mouth 3 (three) times daily.       Multiple Vitamins-Minerals (MULTIVITAMIN PO) Take by mouth daily.       Omega-3 Fatty Acids (FISH OIL MAXIMUM STRENGTH) 1200 MG CPDR Take by mouth 2 (two) times daily.       No facility-administered medications prior to visit.    No Known Allergies  ROS Review of Systems  Constitutional:  Negative for chills.  Gastrointestinal:  Negative for nausea.  Genitourinary:  Positive for frequency, hematuria and urgency. Negative for flank pain.   Negative unless indicated in HPI.    Objective:    Physical Exam Constitutional:      Appearance: Normal appearance.  HENT:     Head: Normocephalic.  Cardiovascular:     Rate and Rhythm: Normal rate and  regular rhythm.     Pulses: Normal pulses.     Heart sounds: Normal heart sounds.  Pulmonary:     Effort: Pulmonary effort is normal.     Breath sounds: Normal breath sounds.  Abdominal:     General: Bowel sounds are normal.     Palpations: Abdomen is soft.     Tenderness: There is no abdominal tenderness. There is no right CVA tenderness or left CVA tenderness.  Musculoskeletal:     Cervical back: Normal range of motion.  Neurological:     General: No focal deficit present.     Mental Status: She is alert. Mental status is at baseline.  Psychiatric:        Mood and Affect: Mood normal.        Behavior: Behavior normal.        Thought Content: Thought content normal.        Judgment: Judgment normal.     BP 120/84   Pulse 78   Temp 97.8 F (36.6  C) (Oral)   Ht 5\' 4"  (1.626 m)   Wt 128 lb 12.8 oz (58.4 kg)   LMP 08/13/2010   SpO2 97%   BMI 22.11 kg/m  Wt Readings from Last 3 Encounters:  06/19/22 128 lb 12.8 oz (58.4 kg)  12/09/21 131 lb (59.4 kg)  11/11/21 130 lb 9.6 oz (59.2 kg)     Health Maintenance  Topic Date Due   HIV Screening  Never done   COVID-19 Vaccine (4 - 2023-24 season) 09/12/2021   PAP SMEAR-Modifier  04/09/2022   Colonoscopy  09/29/2022   INFLUENZA VACCINE  08/13/2022   MAMMOGRAM  09/10/2022   DTaP/Tdap/Td (2 - Td or Tdap) 11/01/2024   Hepatitis C Screening  Completed   Zoster Vaccines- Shingrix  Completed   HPV VACCINES  Aged Out    There are no preventive care reminders to display for this patient.  Lab Results  Component Value Date   TSH 2.05 08/13/2021   Lab Results  Component Value Date   WBC 4.7 08/13/2021   HGB 13.1 08/13/2021   HCT 39.0 08/13/2021   MCV 91.9 08/13/2021   PLT 121.0 (L) 08/13/2021   Lab Results  Component Value Date   NA 141 08/13/2021   K 3.9 08/13/2021   CO2 28 08/13/2021   GLUCOSE 77 08/13/2021   BUN 37 (H) 08/13/2021   CREATININE 0.78 08/13/2021   BILITOT 0.4 08/13/2021   ALKPHOS 93 08/13/2021   AST 43 (H) 08/13/2021   ALT 40 (H) 08/13/2021   PROT 6.0 08/13/2021   ALBUMIN 3.8 08/13/2021   CALCIUM 9.0 08/13/2021   GFR 82.13 08/13/2021   Lab Results  Component Value Date   CHOL 186 09/08/2017   Lab Results  Component Value Date   HDL 89.20 09/08/2017   Lab Results  Component Value Date   LDLCALC 75 09/08/2017   Lab Results  Component Value Date   TRIG 111.0 09/08/2017   Lab Results  Component Value Date   CHOLHDL 2 09/08/2017   No results found for: "HGBA1C"    Assessment & Plan:  UTI symptoms Assessment & Plan: POCT urinalysis positive for trace leukocytes and blood. Will treat with cephalexin 500 mg twice a day for 7 days. Urine culture pending. Advised to increase fluid intake and use OTC Azo for pain as needed.  Orders: -      POCT urinalysis dipstick -     Urine Culture -     Urinalysis,  Routine w reflex microscopic  Other orders -     Cephalexin; Take 1 capsule (500 mg total) by mouth 2 (two) times daily for 7 days.  Dispense: 14 capsule; Refill: 0    Follow-up: Return if symptoms worsen or fail to improve.   Kara Dies, NP

## 2022-06-19 NOTE — Telephone Encounter (Signed)
Pt called stating [facility] [address] [facility]

## 2022-06-19 NOTE — Assessment & Plan Note (Signed)
POCT urinalysis positive for trace leukocytes and blood. Will treat with cephalexin 500 mg twice a day for 7 days. Urine culture pending. Advised to increase fluid intake and use OTC Azo for pain as needed.

## 2022-06-19 NOTE — Telephone Encounter (Signed)
Pt is aware.  

## 2022-06-20 LAB — URINE CULTURE
MICRO NUMBER:: 15055591
Result:: NO GROWTH
SPECIMEN QUALITY:: ADEQUATE

## 2022-06-22 ENCOUNTER — Encounter: Payer: Self-pay | Admitting: Nurse Practitioner

## 2022-06-23 DIAGNOSIS — M9903 Segmental and somatic dysfunction of lumbar region: Secondary | ICD-10-CM | POA: Diagnosis not present

## 2022-06-23 DIAGNOSIS — M9902 Segmental and somatic dysfunction of thoracic region: Secondary | ICD-10-CM | POA: Diagnosis not present

## 2022-06-23 DIAGNOSIS — M542 Cervicalgia: Secondary | ICD-10-CM | POA: Diagnosis not present

## 2022-06-23 DIAGNOSIS — M722 Plantar fascial fibromatosis: Secondary | ICD-10-CM | POA: Diagnosis not present

## 2022-06-23 DIAGNOSIS — M9901 Segmental and somatic dysfunction of cervical region: Secondary | ICD-10-CM | POA: Diagnosis not present

## 2022-07-21 DIAGNOSIS — M722 Plantar fascial fibromatosis: Secondary | ICD-10-CM | POA: Diagnosis not present

## 2022-07-21 DIAGNOSIS — M9901 Segmental and somatic dysfunction of cervical region: Secondary | ICD-10-CM | POA: Diagnosis not present

## 2022-07-21 DIAGNOSIS — M9902 Segmental and somatic dysfunction of thoracic region: Secondary | ICD-10-CM | POA: Diagnosis not present

## 2022-07-21 DIAGNOSIS — M9903 Segmental and somatic dysfunction of lumbar region: Secondary | ICD-10-CM | POA: Diagnosis not present

## 2022-07-21 DIAGNOSIS — M542 Cervicalgia: Secondary | ICD-10-CM | POA: Diagnosis not present

## 2022-08-13 ENCOUNTER — Ambulatory Visit: Payer: 59 | Admitting: Internal Medicine

## 2022-08-18 DIAGNOSIS — M542 Cervicalgia: Secondary | ICD-10-CM | POA: Diagnosis not present

## 2022-08-18 DIAGNOSIS — M722 Plantar fascial fibromatosis: Secondary | ICD-10-CM | POA: Diagnosis not present

## 2022-08-18 DIAGNOSIS — M9901 Segmental and somatic dysfunction of cervical region: Secondary | ICD-10-CM | POA: Diagnosis not present

## 2022-08-18 DIAGNOSIS — M9903 Segmental and somatic dysfunction of lumbar region: Secondary | ICD-10-CM | POA: Diagnosis not present

## 2022-08-18 DIAGNOSIS — M9902 Segmental and somatic dysfunction of thoracic region: Secondary | ICD-10-CM | POA: Diagnosis not present

## 2022-09-15 DIAGNOSIS — M9903 Segmental and somatic dysfunction of lumbar region: Secondary | ICD-10-CM | POA: Diagnosis not present

## 2022-09-15 DIAGNOSIS — M9901 Segmental and somatic dysfunction of cervical region: Secondary | ICD-10-CM | POA: Diagnosis not present

## 2022-09-15 DIAGNOSIS — M542 Cervicalgia: Secondary | ICD-10-CM | POA: Diagnosis not present

## 2022-09-15 DIAGNOSIS — M9902 Segmental and somatic dysfunction of thoracic region: Secondary | ICD-10-CM | POA: Diagnosis not present

## 2022-09-15 DIAGNOSIS — M722 Plantar fascial fibromatosis: Secondary | ICD-10-CM | POA: Diagnosis not present

## 2022-10-13 ENCOUNTER — Encounter: Payer: Self-pay | Admitting: Internal Medicine

## 2022-10-13 ENCOUNTER — Other Ambulatory Visit (HOSPITAL_COMMUNITY)
Admission: RE | Admit: 2022-10-13 | Discharge: 2022-10-13 | Disposition: A | Discharge status: Home / Self Care | Payer: 59 | Source: Ambulatory Visit | Attending: Internal Medicine | Admitting: Internal Medicine

## 2022-10-13 ENCOUNTER — Ambulatory Visit (INDEPENDENT_AMBULATORY_CARE_PROVIDER_SITE_OTHER): Payer: 59 | Admitting: Internal Medicine

## 2022-10-13 VITALS — BP 110/74 | HR 59 | Temp 98.2°F | Ht 64.0 in | Wt 129.0 lb

## 2022-10-13 DIAGNOSIS — Z1231 Encounter for screening mammogram for malignant neoplasm of breast: Secondary | ICD-10-CM | POA: Diagnosis not present

## 2022-10-13 DIAGNOSIS — N952 Postmenopausal atrophic vaginitis: Secondary | ICD-10-CM | POA: Insufficient documentation

## 2022-10-13 DIAGNOSIS — R5383 Other fatigue: Secondary | ICD-10-CM | POA: Diagnosis not present

## 2022-10-13 DIAGNOSIS — Z0000 Encounter for general adult medical examination without abnormal findings: Secondary | ICD-10-CM | POA: Diagnosis not present

## 2022-10-13 DIAGNOSIS — M722 Plantar fascial fibromatosis: Secondary | ICD-10-CM | POA: Diagnosis not present

## 2022-10-13 DIAGNOSIS — R748 Abnormal levels of other serum enzymes: Secondary | ICD-10-CM | POA: Diagnosis not present

## 2022-10-13 DIAGNOSIS — Z124 Encounter for screening for malignant neoplasm of cervix: Secondary | ICD-10-CM | POA: Insufficient documentation

## 2022-10-13 DIAGNOSIS — M542 Cervicalgia: Secondary | ICD-10-CM | POA: Diagnosis not present

## 2022-10-13 DIAGNOSIS — E785 Hyperlipidemia, unspecified: Secondary | ICD-10-CM | POA: Diagnosis not present

## 2022-10-13 DIAGNOSIS — R7301 Impaired fasting glucose: Secondary | ICD-10-CM | POA: Diagnosis not present

## 2022-10-13 DIAGNOSIS — Z8639 Personal history of other endocrine, nutritional and metabolic disease: Secondary | ICD-10-CM

## 2022-10-13 DIAGNOSIS — M9903 Segmental and somatic dysfunction of lumbar region: Secondary | ICD-10-CM | POA: Diagnosis not present

## 2022-10-13 DIAGNOSIS — D696 Thrombocytopenia, unspecified: Secondary | ICD-10-CM

## 2022-10-13 DIAGNOSIS — Z1211 Encounter for screening for malignant neoplasm of colon: Secondary | ICD-10-CM

## 2022-10-13 DIAGNOSIS — M9901 Segmental and somatic dysfunction of cervical region: Secondary | ICD-10-CM | POA: Diagnosis not present

## 2022-10-13 DIAGNOSIS — M9902 Segmental and somatic dysfunction of thoracic region: Secondary | ICD-10-CM | POA: Diagnosis not present

## 2022-10-13 LAB — CBC WITH DIFFERENTIAL/PLATELET
Basophils Absolute: 0 10*3/uL (ref 0.0–0.1)
Basophils Relative: 0.7 % (ref 0.0–3.0)
Eosinophils Absolute: 0.1 10*3/uL (ref 0.0–0.7)
Eosinophils Relative: 2.2 % (ref 0.0–5.0)
HCT: 41.1 % (ref 36.0–46.0)
Hemoglobin: 13.7 g/dL (ref 12.0–15.0)
Lymphocytes Relative: 33.5 % (ref 12.0–46.0)
Lymphs Abs: 1.6 10*3/uL (ref 0.7–4.0)
MCHC: 33.3 g/dL (ref 30.0–36.0)
MCV: 92.5 fL (ref 78.0–100.0)
Monocytes Absolute: 0.3 10*3/uL (ref 0.1–1.0)
Monocytes Relative: 5.1 % (ref 3.0–12.0)
Neutro Abs: 2.9 10*3/uL (ref 1.4–7.7)
Neutrophils Relative %: 58.5 % (ref 43.0–77.0)
Platelets: 128 10*3/uL — ABNORMAL LOW (ref 150.0–400.0)
RBC: 4.44 Mil/uL (ref 3.87–5.11)
RDW: 14.1 % (ref 11.5–15.5)
WBC: 4.9 10*3/uL (ref 4.0–10.5)

## 2022-10-13 LAB — COMPREHENSIVE METABOLIC PANEL
ALT: 36 U/L — ABNORMAL HIGH (ref 0–35)
AST: 37 U/L (ref 0–37)
Albumin: 3.7 g/dL (ref 3.5–5.2)
Alkaline Phosphatase: 111 U/L (ref 39–117)
BUN: 31 mg/dL — ABNORMAL HIGH (ref 6–23)
CO2: 29 meq/L (ref 19–32)
Calcium: 9 mg/dL (ref 8.4–10.5)
Chloride: 105 meq/L (ref 96–112)
Creatinine, Ser: 0.67 mg/dL (ref 0.40–1.20)
GFR: 93.74 mL/min (ref >60.00)
Glucose, Bld: 91 mg/dL (ref 70–99)
Potassium: 4 meq/L (ref 3.5–5.1)
Sodium: 140 meq/L (ref 135–145)
Total Bilirubin: 0.6 mg/dL (ref 0.2–1.2)
Total Protein: 6 g/dL (ref 6.0–8.3)

## 2022-10-13 LAB — IBC + FERRITIN
Ferritin: 84.3 ng/mL (ref 10.0–291.0)
Iron: 144 ug/dL (ref 42–145)
Saturation Ratios: 38.4 % (ref 20.0–50.0)
TIBC: 375.2 ug/dL (ref 250.0–450.0)
Transferrin: 268 mg/dL (ref 212.0–360.0)

## 2022-10-13 LAB — TSH: TSH: 1.59 u[IU]/mL (ref 0.35–5.50)

## 2022-10-13 LAB — LIPID PANEL
Cholesterol: 208 mg/dL — ABNORMAL HIGH (ref 0–200)
HDL: 102 mg/dL (ref >39.00)
LDL Cholesterol: 87 mg/dL (ref 0–99)
NonHDL: 105.67
Total CHOL/HDL Ratio: 2
Triglycerides: 91 mg/dL (ref 0.0–149.0)
VLDL: 18.2 mg/dL (ref 0.0–40.0)

## 2022-10-13 LAB — B12 AND FOLATE PANEL
Folate: >24.2 ng/mL (ref >5.9)
Vitamin B-12: 1328 pg/mL — ABNORMAL HIGH (ref 211–911)

## 2022-10-13 LAB — HEMOGLOBIN A1C: Hgb A1c MFr Bld: 5.7 % (ref 4.6–6.5)

## 2022-10-13 MED ORDER — ESTRADIOL 0.1 MG/GM VA CREA
1.0000 g | TOPICAL_CREAM | Freq: Every day | VAGINAL | 12 refills | Status: DC
Start: 2022-10-13 — End: 2022-12-17

## 2022-10-13 NOTE — Assessment & Plan Note (Signed)
Persistent , mild,  stable.  Repeat annually.  Workup has historically been deferred   Lab Results  Component Value Date   WBC 4.9 10/13/2022   HGB 13.7 10/13/2022   HCT 41.1 10/13/2022   MCV 92.5 10/13/2022   PLT 128.0 (L) 10/13/2022

## 2022-10-13 NOTE — Assessment & Plan Note (Signed)
Based on exam today.  Trial of estrace vaginal cream.

## 2022-10-13 NOTE — Progress Notes (Addendum)
Patient ID: Crystal Erickson, female    DOB: [birth year]  Age: 62 y.o. MRN: [id number]  The patient is here for annual preventive examination and management of other chronic and acute problems.   The risk factors are reflected in the social history.   The roster of all physicians providing medical care to patient - is listed in the Snapshot section of the chart.   Activities of daily living:  The patient is 100% independent in all ADLs: dressing, toileting, feeding as well as independent mobility   Home safety : The patient has smoke detectors in the home. They wear seatbelts.  There are no unsecured firearms at home. There is no violence in the home.    There is no risks for hepatitis, STDs or HIV. There is no   history of blood transfusion. They have no travel history to infectious disease endemic areas of the world.   The patient has seen their dentist in the last six month. They have seen their eye doctor in the last year. The patinet  denies slight hearing difficulty with regard to whispered voices and some television programs.  They have deferred audiologic testing in the last year.  They do not  have excessive sun exposure. Discussed the need for sun protection: hats, long sleeves and use of sunscreen if there is significant sun exposure.    Diet: the importance of a healthy diet is discussed. They do have a healthy diet.   The benefits of regular aerobic exercise were discussed. 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    Depression screen: there are no signs or vegative symptoms of depression- irritability, change in appetite, anhedonia, sadness/tearfullness.   The following portions of the patient's history were reviewed and updated as appropriate: allergies, current medications, past family history, past medical history,  past surgical history, past social history  and problem list.   Visual acuity was not assessed per patient  preference since the patient has regular follow up with an  ophthalmologist. Hearing and body mass index were assessed and reviewed.    During the course of the visit the patient was educated and counseled about appropriate screening and preventive services including : fall prevention , diabetes screening, nutrition counseling, colorectal cancer screening, and recommended immunizations.    Chief Complaint:   Recent vaginal itching  Review of Symptoms  Patient denies headache, fevers, malaise, unintentional weight loss, skin rash, eye pain, sinus congestion and sinus pain, sore throat, dysphagia,  hemoptysis , cough, dyspnea, wheezing, chest pain, palpitations, orthopnea, edema, abdominal pain, nausea, melena, diarrhea, constipation, flank pain, dysuria, hematuria, urinary  Frequency, nocturia, numbness, tingling, seizures,  Focal weakness, Loss of consciousness,  Tremor, insomnia, depression, anxiety, and suicidal ideation.    Physical Exam:  BP 110/74   Pulse (!) 59   Temp 98.2 F (36.8 C) (Oral)   Ht 5\' 4"  (1.626 m)   Wt 129 lb (58.5 kg)   LMP 08/13/2010   SpO2 99%   BMI 22.14 kg/m    Physical Exam Vitals reviewed.  Constitutional:      General: She is not in acute distress.    Appearance: Normal appearance. She is well-developed and normal weight. She is not ill-appearing, toxic-appearing or diaphoretic.  HENT:     Head: Normocephalic.     Right Ear: Tympanic membrane, ear canal and external ear normal. There is no impacted cerumen.     Left  Ear: Tympanic membrane, ear canal and external ear normal. There is no impacted cerumen.     Nose: Nose normal.     Mouth/Throat:     Mouth: Mucous membranes are moist.     Pharynx: Oropharynx is clear.  Eyes:     General: No scleral icterus.       Right eye: No discharge.        Left eye: No discharge.     Conjunctiva/sclera: Conjunctivae normal.     Pupils: Pupils are equal, round, and reactive to light.  Neck:     Thyroid: No  thyromegaly.     Vascular: No carotid bruit or JVD.  Cardiovascular:     Rate and Rhythm: Normal rate and regular rhythm.     Heart sounds: Normal heart sounds.  Pulmonary:     Effort: Pulmonary effort is normal. No respiratory distress.     Breath sounds: Normal breath sounds.  Chest:  Breasts:    Breasts are symmetrical.     Right: Normal. No swelling, inverted nipple, mass, nipple discharge, skin change or tenderness.     Left: Normal. No swelling, inverted nipple, mass, nipple discharge, skin change or tenderness.  Abdominal:     General: Bowel sounds are normal.     Palpations: Abdomen is soft. There is no mass.     Tenderness: There is no abdominal tenderness. There is no guarding or rebound.     Hernia: There is no hernia in the left inguinal area or right inguinal area.  Genitourinary:    General: Normal vulva.     Exam position: Lithotomy position.     Pubic Area: No rash or pubic lice.      Labia:        Right: No rash, tenderness, lesion or injury.        Left: No rash, tenderness, lesion or injury.      Vagina: Normal.     Cervix: Normal.     Uterus: Normal.      Adnexa: Right adnexa normal and left adnexa normal.  Musculoskeletal:        General: Normal range of motion.     Cervical back: Normal range of motion and neck supple.  Lymphadenopathy:     Cervical: No cervical adenopathy.     Upper Body:     Right upper body: No supraclavicular, axillary or pectoral adenopathy.     Left upper body: No supraclavicular, axillary or pectoral adenopathy.     Lower Body: No right inguinal adenopathy. No left inguinal adenopathy.  Skin:    General: Skin is warm and dry.  Neurological:     General: No focal deficit present.     Mental Status: She is alert and oriented to person, place, and time. Mental status is at baseline.  Psychiatric:        Mood and Affect: Mood normal.        Behavior: Behavior normal.        Thought Content: Thought content normal.         Judgment: Judgment normal.     Assessment and Plan: Encounter for preventive health examination Assessment & Plan: age appropriate education and counseling updated, referrals for preventative services and immunizations addressed, dietary and smoking counseling addressed, most recent labs reviewed.  I have personally reviewed and have noted:   1) the patient's medical and social history 2) The pt's use of alcohol, tobacco, and illicit drugs 3) The patient's current medications and supplements 4) Functional  ability including ADL's, fall risk, home safety risk, hearing and visual impairment 5) Diet and physical activities 6) Evidence for depression or mood disorder 7) The patient's height, weight, and BMI have been recorded in the chart  I have made referrals, and provided counseling and education based on review of the above    Encounter for screening mammogram for malignant neoplasm of breast -     3D Screening Mammogram, Left and Right; Future  Other fatigue Assessment & Plan:  iron stores,  b12, folate and thyroid, all normal.   Lab Results  Component Value Date   IRON 144 10/13/2022   TIBC 375.2 10/13/2022   FERRITIN 84.3 10/13/2022   Lab Results  Component Value Date   TSH 1.59 10/13/2022   Last vitamin B12 and Folate Lab Results  Component Value Date   VITAMINB12 1,328 (H) 10/13/2022   FOLATE >24.2 10/13/2022       Hyperlipidemia, unspecified hyperlipidemia type -     Lipid panel -     Comprehensive metabolic panel  Colon cancer screening -     Ambulatory referral to Gastroenterology  Cervical cancer screening -     Cytology - PAP  Fatigue, unspecified type Assessment & Plan:  iron stores,  b12, folate and thyroid, all normal.   Lab Results  Component Value Date   IRON 144 10/13/2022   TIBC 375.2 10/13/2022   FERRITIN 84.3 10/13/2022   Lab Results  Component Value Date   TSH 1.59 10/13/2022   Last vitamin B12 and Folate Lab Results  Component  Value Date   VITAMINB12 1,328 (H) 10/13/2022   FOLATE >24.2 10/13/2022      Orders: -     TSH -     CBC with Differential/Platelet -     B12 and Folate Panel -     IBC + Ferritin  Impaired fasting glucose -     Hemoglobin A1c  Post-menopause atrophic vaginitis Assessment & Plan: Based on exam today.  Trial of estrace vaginal cream.    Elevated liver enzymes Assessment & Plan: Chronic,  Suspect due to muscle enzyme elevation   Lab Results  Component Value Date   ALT 36 (H) 10/13/2022   AST 37 10/13/2022   ALKPHOS 111 10/13/2022   BILITOT 0.6 10/13/2022   Chronic,  Suspect due to muscle enzyme elevation    History of Hashimoto thyroiditis Assessment & Plan: continued annual checks of thyroid function have been normal  Lab Results  Component Value Date   TSH 1.59 10/13/2022      Thrombocytopenia (HCC) Assessment & Plan: Persistent , mild,  stable.  Repeat annually.  Workup has historically been deferred   Lab Results  Component Value Date   WBC 4.9 10/13/2022   HGB 13.7 10/13/2022   HCT 41.1 10/13/2022   MCV 92.5 10/13/2022   PLT 128.0 (L) 10/13/2022      Other orders -     Estradiol; Place 1 g vaginally at bedtime. FOR 14 DAYS, then twice weekly thereafter (Patient not taking: Reported on 11/19/2022)  Dispense: 42.5 g; Refill: 12    Return in about 1 year (around 10/13/2023).  Sherlene Shams, MD

## 2022-10-13 NOTE — Assessment & Plan Note (Signed)
Chronic,  Suspect due to muscle enzyme elevation   Lab Results  Component Value Date   ALT 36 (H) 10/13/2022   AST 37 10/13/2022   ALKPHOS 111 10/13/2022   BILITOT 0.6 10/13/2022   Chronic,  Suspect due to muscle enzyme elevation

## 2022-10-13 NOTE — Patient Instructions (Signed)
Your annual mammogram has been ordered please call [place] to schedule your appointment [phone]    Referral to [facility] for colonoscopy is in progress  Estrogen cream  trial for the vaginal dryness/itching:  Insert 1 gram intravaginally at bedtime daily for 2 weeks,  Then reduce use to twice weekly thereafter

## 2022-10-13 NOTE — Assessment & Plan Note (Signed)

## 2022-10-13 NOTE — Assessment & Plan Note (Signed)
continued annual checks of thyroid function have been normal  Lab Results  Component Value Date   TSH 1.59 10/13/2022

## 2022-10-13 NOTE — Assessment & Plan Note (Addendum)
iron stores,  b12, folate and thyroid, all normal.   Lab Results  Component Value Date   IRON 144 10/13/2022   TIBC 375.2 10/13/2022   FERRITIN 84.3 10/13/2022   Lab Results  Component Value Date   TSH 1.59 10/13/2022   Last vitamin B12 and Folate Lab Results  Component Value Date   VITAMINB12 1,328 (H) 10/13/2022   FOLATE >24.2 10/13/2022

## 2022-10-15 ENCOUNTER — Telehealth: Payer: Self-pay

## 2022-10-15 ENCOUNTER — Other Ambulatory Visit: Payer: Self-pay

## 2022-10-15 DIAGNOSIS — Z1211 Encounter for screening for malignant neoplasm of colon: Secondary | ICD-10-CM

## 2022-10-15 MED ORDER — NA SULFATE-K SULFATE-MG SULF 17.5-3.13-1.6 GM/177ML PO SOLN
1.0000 | Freq: Once | ORAL | 0 refills | Status: AC
Start: 2022-10-15 — End: 2022-10-15

## 2022-10-15 NOTE — Telephone Encounter (Signed)
Gastroenterology Pre-Procedure Review  Request Date: 11/12/22 Requesting Physician: Dr. Allegra Lai  PATIENT REVIEW QUESTIONS: The patient responded to the following health history questions as indicated:    1. Are you having any GI issues? no 2. Do you have a personal history of Polyps? no 3. Do you have a family history of Colon Cancer or Polyps? no 4. Diabetes Mellitus? no 5. Joint replacements in the past 12 months?no 6. Major health problems in the past 3 months?no 7. Any artificial heart valves, MVP, or defibrillator?no    MEDICATIONS & ALLERGIES:    Patient reports the following regarding taking any anticoagulation/antiplatelet therapy:   Plavix, Coumadin, Eliquis, Xarelto, Lovenox, Pradaxa, Brilinta, or Effient? no Aspirin? no  Patient confirms/reports the following medications:  Current Outpatient Medications  Medication Sig Dispense Refill   Ascorbic Acid (VITAMIN C) 1000 MG tablet Take 1,000 mg by mouth 4 (four) times daily.       b complex vitamins tablet Take 1 tablet by mouth 2 (two) times daily.       cholecalciferol (VITAMIN D) 1000 UNITS tablet Take 1,000 Units by mouth daily.       estradiol (ESTRACE) 0.1 MG/GM vaginal cream Place 1 g vaginally at bedtime. FOR 14 DAYS, then twice weekly thereafter 42.5 g 12   Glucosamine-Chondroit-Vit C-Mn (GLUCOSAMINE 1500 COMPLEX PO) Take by mouth 2 (two) times daily.       GLUTAMINE PO Take by mouth 3 (three) times daily.       Multiple Vitamins-Minerals (MULTIVITAMIN PO) Take by mouth daily.       Omega-3 Fatty Acids (FISH OIL MAXIMUM STRENGTH) 1200 MG CPDR Take by mouth 2 (two) times daily.       No current facility-administered medications for this visit.    Patient confirms/reports the following allergies:  No Known Allergies  No orders of the defined types were placed in this encounter.   AUTHORIZATION INFORMATION Primary Insurance: 1D#: Group #:  Secondary Insurance: 1D#: Group #:  SCHEDULE INFORMATION: Date:  11/12/22 Time: Location: [facility]

## 2022-10-19 ENCOUNTER — Telehealth: Payer: Self-pay

## 2022-10-19 NOTE — Telephone Encounter (Signed)
Patient call returned to reschedule colonoscopy to [facility].  Informed her that I didn't have anything on 11/12/22.  Patient has agreed with physician change to Dr. Servando Snare at [facility] on 11/16/22.  Rosann Auerbach has been asked to place order in the depot.  Kim at [facility] has been informed of date change and location change.  Thanks,  [place], [place]

## 2022-10-19 NOTE — Telephone Encounter (Signed)
Pt requesting call back to to reschedule procedure at [facility] location

## 2022-10-21 LAB — CYTOLOGY - PAP
Comment: NEGATIVE
Diagnosis: NEGATIVE
Diagnosis: REACTIVE
High risk HPV: NEGATIVE

## 2022-11-05 ENCOUNTER — Encounter: Payer: Self-pay | Admitting: Gastroenterology

## 2022-11-13 ENCOUNTER — Ambulatory Visit
Admission: RE | Admit: 2022-11-13 | Discharge: 2022-11-13 | Disposition: A | Discharge status: Home / Self Care | Payer: 59 | Source: Ambulatory Visit | Attending: Internal Medicine | Admitting: Internal Medicine

## 2022-11-13 DIAGNOSIS — Z1231 Encounter for screening mammogram for malignant neoplasm of breast: Secondary | ICD-10-CM | POA: Insufficient documentation

## 2022-11-16 ENCOUNTER — Other Ambulatory Visit: Payer: Self-pay

## 2022-11-16 ENCOUNTER — Ambulatory Visit: Payer: 59 | Admitting: Anesthesiology

## 2022-11-16 ENCOUNTER — Encounter: Payer: Self-pay | Admitting: Gastroenterology

## 2022-11-16 ENCOUNTER — Encounter
Admission: RE | Disposition: A | Discharge status: Home / Self Care | Payer: Self-pay | Source: Home / Self Care | Attending: Gastroenterology

## 2022-11-16 ENCOUNTER — Ambulatory Visit
Admission: RE | Admit: 2022-11-16 | Discharge: 2022-11-16 | Disposition: A | Discharge status: Home / Self Care | Payer: 59 | Attending: Gastroenterology | Admitting: Gastroenterology

## 2022-11-16 DIAGNOSIS — C189 Malignant neoplasm of colon, unspecified: Secondary | ICD-10-CM

## 2022-11-16 DIAGNOSIS — C183 Malignant neoplasm of hepatic flexure: Secondary | ICD-10-CM

## 2022-11-16 DIAGNOSIS — K633 Ulcer of intestine: Secondary | ICD-10-CM | POA: Diagnosis not present

## 2022-11-16 DIAGNOSIS — Z1211 Encounter for screening for malignant neoplasm of colon: Secondary | ICD-10-CM | POA: Diagnosis not present

## 2022-11-16 DIAGNOSIS — D490 Neoplasm of unspecified behavior of digestive system: Secondary | ICD-10-CM

## 2022-11-16 HISTORY — DX: Other specified health status: Z78.9

## 2022-11-16 HISTORY — DX: Malignant neoplasm of colon, unspecified: C18.9

## 2022-11-16 HISTORY — PX: COLONOSCOPY WITH PROPOFOL: SHX5780

## 2022-11-16 SURGERY — COLONOSCOPY WITH PROPOFOL
Anesthesia: General | Site: Rectum

## 2022-11-16 MED ORDER — PROPOFOL 10 MG/ML IV BOLUS
INTRAVENOUS | Status: AC
  Filled 2022-11-16: qty 40

## 2022-11-16 MED ORDER — PROPOFOL 10 MG/ML IV BOLUS
INTRAVENOUS | Status: AC
  Filled 2022-11-16: qty 20

## 2022-11-16 MED ORDER — LACTATED RINGERS IV SOLN: INTRAVENOUS | Status: DC

## 2022-11-16 MED ORDER — LIDOCAINE HCL (CARDIAC) PF 100 MG/5ML IV SOSY
PREFILLED_SYRINGE | INTRAVENOUS | Status: DC | PRN
  Administered 2022-11-16: 50 mg via INTRAVENOUS

## 2022-11-16 MED ORDER — SODIUM CHLORIDE 0.9% FLUSH: 10.0000 mL | INTRAVENOUS | Status: DC | PRN

## 2022-11-16 MED ORDER — SODIUM CHLORIDE 0.9 % IV SOLN: INTRAVENOUS | Status: DC

## 2022-11-16 MED ORDER — PROPOFOL 10 MG/ML IV BOLUS
INTRAVENOUS | Status: DC | PRN
  Administered 2022-11-16 (×2): 30 mg via INTRAVENOUS
  Administered 2022-11-16 (×3): 20 mg via INTRAVENOUS
  Administered 2022-11-16: 30 mg via INTRAVENOUS
  Administered 2022-11-16: 10 mg via INTRAVENOUS
  Administered 2022-11-16: 40 mg via INTRAVENOUS
  Administered 2022-11-16: 20 mg via INTRAVENOUS
  Administered 2022-11-16: 30 mg via INTRAVENOUS
  Administered 2022-11-16 (×2): 40 mg via INTRAVENOUS
  Administered 2022-11-16: 80 mg via INTRAVENOUS

## 2022-11-16 SURGICAL SUPPLY — 10 items
FORCEPS BIOP RAD 4 LRG CAP 4 (CUTTING FORCEPS) IMPLANT
GOWN CVR UNV OPN BCK APRN NK (MISCELLANEOUS) ×2 IMPLANT
GOWN ISOL THUMB LOOP REG UNIV (MISCELLANEOUS) ×2
INJECTOR VARIJECT VIN23 (MISCELLANEOUS) IMPLANT
KIT PRC NS LF DISP ENDO (KITS) ×1 IMPLANT
KIT PROCEDURE OLYMPUS (KITS) ×1
MANIFOLD NEPTUNE II (INSTRUMENTS) ×1 IMPLANT
MARKER SPOT ENDO TATTOO 5ML (MISCELLANEOUS) IMPLANT
VARIJECT INJECTOR VIN23 (MISCELLANEOUS) ×1
WATER STERILE IRR 250ML POUR (IV SOLUTION) ×1 IMPLANT

## 2022-11-16 NOTE — Anesthesia Preprocedure Evaluation (Signed)
Anesthesia Evaluation  Patient identified by MRN, date of birth, ID band Patient awake    Reviewed: Allergy & Precautions, H&P , NPO status , Patient's Chart, lab work & pertinent test results  Airway Mallampati: II  TM Distance: >3 FB Neck ROM: Full    Dental no notable dental hx.  Permanent upper bridge:   Pulmonary neg pulmonary ROS   Pulmonary exam normal breath sounds clear to auscultation       Cardiovascular negative cardio ROS Normal cardiovascular exam Rhythm:Regular Rate:Normal     Neuro/Psych negative neurological ROS  negative psych ROS   GI/Hepatic negative GI ROS, Neg liver ROS,,,  Endo/Other  negative endocrine ROS    Renal/GU negative Renal ROS  negative genitourinary   Musculoskeletal negative musculoskeletal ROS (+)    Abdominal   Peds negative pediatric ROS (+)  Hematology negative hematology ROS (+)   Anesthesia Other Findings   Reproductive/Obstetrics negative OB ROS                              Anesthesia Physical Anesthesia Plan  ASA: 1  Anesthesia Plan: General   Post-op Pain Management:    Induction: Intravenous  PONV Risk Score and Plan:   Airway Management Planned: Natural Airway and Nasal Cannula  Additional Equipment:   Intra-op Plan:   Post-operative Plan:   Informed Consent: I have reviewed the patients History and Physical, chart, labs and discussed the procedure including the risks, benefits and alternatives for the proposed anesthesia with the patient or authorized representative who has indicated his/her understanding and acceptance.     Dental Advisory Given  Plan Discussed with: Anesthesiologist, CRNA and Surgeon  Anesthesia Plan Comments: (Patient consented for risks of anesthesia including but not limited to:  - adverse reactions to medications - risk of airway placement if required - damage to eyes, teeth, lips or other oral  mucosa - nerve damage due to positioning  - sore throat or hoarseness - Damage to heart, brain, nerves, lungs, other parts of body or loss of life  Patient voiced understanding and assent.)         Anesthesia Quick Evaluation

## 2022-11-16 NOTE — Transfer of Care (Signed)
Immediate Anesthesia Transfer of Care Note  Patient: Crystal Erickson  Procedure(s) Performed: COLONOSCOPY WITH PROPOFOL (Rectum)  Patient Location: PACU  Anesthesia Type: General  Level of Consciousness: awake, alert  and patient cooperative  Airway and Oxygen Therapy: Patient Spontanous Breathing and Patient connected to supplemental oxygen  Post-op Assessment: Post-op Vital signs reviewed, Patient's Cardiovascular Status Stable, Respiratory Function Stable, Patent Airway and No signs of Nausea or vomiting  Post-op Vital Signs: Reviewed and stable  Complications: No notable events documented.

## 2022-11-16 NOTE — H&P (Signed)
Midge Minium, MD [facility] [address] [place], [place] [address] Phone: [phone] Fax : [phone]  Primary Care Physician:  Sherlene Shams, MD Primary Gastroenterologist:  Dr. Servando Snare  Pre-Procedure History & Physical: HPI:  Crystal Erickson is a 62 y.o. female is here for a screening colonoscopy.   Past Medical History:  Diagnosis Date   Medical history non-contributory     History reviewed. No pertinent surgical history.  Prior to Admission medications   Medication Sig Start Date End Date Taking? Authorizing Provider  Ascorbic Acid (VITAMIN C) 1000 MG tablet Take 1,000 mg by mouth 4 (four) times daily.     Yes [provider]  b complex vitamins tablet Take 1 tablet by mouth 2 (two) times daily.     Yes [provider]  cholecalciferol (VITAMIN D) 1000 UNITS tablet Take 1,000 Units by mouth daily.     Yes [provider]  estradiol (ESTRACE) 0.1 MG/GM vaginal cream Place 1 g vaginally at bedtime. FOR 14 DAYS, then twice weekly thereafter 10/13/22  Yes Sherlene Shams, MD  Glucosamine-Chondroit-Vit C-Mn (GLUCOSAMINE 1500 COMPLEX PO) Take by mouth 2 (two) times daily.     Yes [provider]  GLUTAMINE PO Take by mouth 3 (three) times daily.     Yes [provider]  Multiple Vitamins-Minerals (MULTIVITAMIN PO) Take by mouth daily.     Yes [provider]  Omega-3 Fatty Acids (FISH OIL MAXIMUM STRENGTH) 1200 MG CPDR Take by mouth 2 (two) times daily.     Yes [provider]    Allergies as of 10/15/2022   (No Known Allergies)    Family History  Problem Relation Age of Onset   Mental illness Mother 2       alzheimer's   Cancer Mother        metastatic unknown primary   Diabetes Father    Breast cancer Maternal Aunt     Social History   Socioeconomic History   Marital status: Single    Spouse name: Not on file   Number of children: Not on file   Years of education: Not on file   Highest  education level: Not on file  Occupational History   Occupation: Systems analyst    Employer: [employer]  Tobacco Use   Smoking status: Never   Smokeless tobacco: Never  Substance and Sexual Activity   Alcohol use: Yes    Alcohol/week: 0.0 standard drinks of alcohol   Drug use: No   Sexual activity: Not on file  Other Topics Concern   Not on file  Social History Narrative   Not on file   Social Determinants of Health   Financial Resource Strain: Not on file  Food Insecurity: Not on file  Transportation Needs: Not on file  Physical Activity: Not on file  Stress: Not on file  Social Connections: Not on file  Intimate Partner Violence: Not on file    Review of Systems: See HPI, otherwise negative ROS  Physical Exam: BP 133/77   Pulse (!) 58   Temp (!) 97 F (36.1 C) (Temporal)   Resp 12   Ht 5\' 4"  (1.626 m)   Wt 57.3 kg   LMP 08/13/2010   SpO2 100%   BMI 21.70 kg/m  General:   Alert,  pleasant and cooperative in NAD Head:  Normocephalic and atraumatic. Neck:  Supple; no masses or thyromegaly. Lungs:  Clear throughout to auscultation.    Heart:  Regular rate  and rhythm. Abdomen:  Soft, nontender and nondistended. Normal bowel sounds, without guarding, and without rebound.   Neurologic:  Alert and  oriented x4;  grossly normal neurologically.  Impression/Plan: Crystal Erickson is now here to undergo a screening colonoscopy.  Risks, benefits, and alternatives regarding colonoscopy have been reviewed with the patient.  Questions have been answered.  All parties agreeable.

## 2022-11-16 NOTE — Op Note (Signed)
[facility] Gastroenterology Patient Name: Crystal Erickson Procedure Date: 11/16/2022 10:19 AM MRN: [id number] Account #: [id number] Date of Birth: [birth year] Admit Type: Outpatient Age: 62 Room: [facility] OR ROOM 01 Gender: Female Note Status: Supervisor Override Instrument Name: [id number] Procedure:             Colonoscopy Indications:           Screening for colorectal malignant neoplasm Providers:             Midge Minium MD, MD Referring MD:          Duncan Dull, MD (Referring MD) Medicines:             Propofol per Anesthesia Complications:         No immediate complications. Procedure:             Pre-Anesthesia Assessment:                        - Prior to the procedure, a History and Physical was                         performed, and patient medications and allergies were                         reviewed. The patient's tolerance of previous                         anesthesia was also reviewed. The risks and benefits                         of the procedure and the sedation options and risks                         were discussed with the patient. All questions were                         answered, and informed consent was obtained. Prior                         Anticoagulants: The patient has taken no anticoagulant                         or antiplatelet agents. ASA Grade Assessment: II - A                         patient with mild systemic disease. After reviewing                         the risks and benefits, the patient was deemed in                         satisfactory condition to undergo the procedure.                        After obtaining informed consent, the colonoscope was                         passed under direct vision. Throughout the procedure,  the patient's blood pressure, pulse, and oxygen                         saturations were monitored continuously. The                         Colonoscope was introduced through the  anus and                         advanced to the the cecum, identified by appendiceal                         orifice and ileocecal valve. The colonoscopy was                         performed without difficulty. The patient tolerated                         the procedure well. The quality of the bowel                         preparation was poor. Findings:      The perianal and digital rectal examinations were normal.      An ulcerated non-obstructing medium-sized mass was found at the hepatic       flexure. The mass was non-circumferential. In addition, its diameter       measured twenty mm. Oozing was present. This was biopsied with a cold       forceps for histology. Area was tattooed with an injection of [place] ink. Impression:            - Preparation of the colon was poor.                        - Tumor at the hepatic flexure. Biopsied. Tattooed. Recommendation:        - Discharge patient to home.                        - Resume previous diet.                        - Continue present medications.                        - Await pathology results.                        - If the pathology report is malignant, then refer to                         a surgeon. Procedure Code(s):     --- Professional ---                        [id number], Colonoscopy, flexible; with biopsy, single or                         multiple                        45381, Colonoscopy, flexible; with directed submucosal  injection(s), any substance Diagnosis Code(s):     --- Professional ---                        Z12.11, Encounter for screening for malignant neoplasm                         of colon                        D49.0, Neoplasm of unspecified behavior of digestive                         system CPT copyright 2022 American Medical Association. All rights reserved. The codes documented in this report are preliminary and upon coder review may  be revised to meet current compliance  requirements. Midge Minium MD, MD 11/16/2022 11:04:49 AM This report has been signed electronically. Number of Addenda: 0 Note Initiated On: 11/16/2022 10:19 AM Scope Withdrawal Time: 0 hours 18 minutes 56 seconds  Total Procedure Duration: 0 hours 30 minutes 58 seconds  Estimated Blood Loss:  Estimated blood loss: none.      [facility] [place] [facility]

## 2022-11-16 NOTE — Anesthesia Postprocedure Evaluation (Signed)
Anesthesia Post Note  Patient: Crystal Erickson  Procedure(s) Performed: COLONOSCOPY WITH PROPOFOL (Rectum)  Patient location during evaluation: PACU Anesthesia Type: General Level of consciousness: awake and alert Pain management: pain level controlled Vital Signs Assessment: post-procedure vital signs reviewed and stable Respiratory status: spontaneous breathing, nonlabored ventilation, respiratory function stable and patient connected to nasal cannula oxygen Cardiovascular status: blood pressure returned to baseline and stable Postop Assessment: no apparent nausea or vomiting Anesthetic complications: no   No notable events documented.   Last Vitals:  Vitals:   11/16/22 1115 11/16/22 1120  BP: 100/75   Pulse: 75 60  Resp: 19 15  Temp:    SpO2: 99% 100%    Last Pain:  Vitals:   11/16/22 1111  TempSrc:   PainSc: 0-No pain                 Danh Bayus C Odarius Dines

## 2022-11-17 ENCOUNTER — Encounter: Payer: Self-pay | Admitting: Internal Medicine

## 2022-11-17 ENCOUNTER — Telehealth: Payer: Self-pay

## 2022-11-17 ENCOUNTER — Other Ambulatory Visit: Payer: Self-pay

## 2022-11-17 ENCOUNTER — Encounter: Payer: Self-pay | Admitting: Gastroenterology

## 2022-11-17 DIAGNOSIS — K6389 Other specified diseases of intestine: Secondary | ICD-10-CM

## 2022-11-17 LAB — SURGICAL PATHOLOGY

## 2022-11-17 MED ORDER — ALPRAZOLAM 0.5 MG PO TABS: 0.5000 mg | ORAL_TABLET | Freq: Two times a day (BID) | ORAL | 2 refills | Status: AC | PRN

## 2022-11-17 NOTE — Telephone Encounter (Signed)
Patient called back to speak with the nurse

## 2022-11-17 NOTE — Telephone Encounter (Signed)
Pt is aware and gave a verbal understanding.  

## 2022-11-17 NOTE — Telephone Encounter (Signed)
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  Patient states she would like to know if Dr. Duncan Dull could prescribe something to help her sleep because this is on her mind.  Patient states her preferred pharmacy is [facility] on [address] in [place].

## 2022-11-17 NOTE — Telephone Encounter (Signed)
Pt is aware as instructed and expressed understanding... She will call surgical back to schedule the appt as they have already lmovm

## 2022-11-17 NOTE — Telephone Encounter (Signed)
FYI

## 2022-11-17 NOTE — Telephone Encounter (Signed)
Please see telephone encounter

## 2022-11-17 NOTE — Telephone Encounter (Signed)
Yes,  I have refilled the alprazolam that she was prescribed last year.

## 2022-11-17 NOTE — Telephone Encounter (Signed)
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 Please call and set up with surgery.   Urgent referral sent to general surgery and oncology   Left message on voicemail requesting pt return my call

## 2022-11-19 ENCOUNTER — Encounter: Payer: Self-pay | Admitting: General Surgery

## 2022-11-19 ENCOUNTER — Ambulatory Visit: Payer: 59 | Admitting: General Surgery

## 2022-11-19 VITALS — BP 106/68 | HR 87 | Temp 98.0°F | Ht 64.0 in | Wt 125.0 lb

## 2022-11-19 DIAGNOSIS — C189 Malignant neoplasm of colon, unspecified: Secondary | ICD-10-CM

## 2022-11-19 MED ORDER — POLYETHYLENE GLYCOL 3350 17 GM/SCOOP PO POWD: ORAL | 0 refills | Status: DC

## 2022-11-19 MED ORDER — METRONIDAZOLE 500 MG PO TABS: ORAL_TABLET | ORAL | 0 refills | Status: DC

## 2022-11-19 MED ORDER — BISACODYL 5 MG PO TBEC: DELAYED_RELEASE_TABLET | ORAL | 0 refills | Status: DC

## 2022-11-19 MED ORDER — NEOMYCIN SULFATE 500 MG PO TABS: ORAL_TABLET | ORAL | 0 refills | Status: DC

## 2022-11-19 NOTE — Patient Instructions (Addendum)
We will get you scheduled for a CT scan of the Chest, Abdomen, and pelvis.   We would like for you to have some blood work done prior to surgery. This can be done anytime at [facility], go in through the [facility] entrance.   You are scheduled for your CT scans at [facility] on 11/24/22. You will need to arrive at the Medical Mall at 3:30 pm.    We have discussed removing a portion of your damaged colon through 4 small incisions today. We will schedule this surgery at [facility] with Dr. Maurine Minister. Please plan a hospital stay of 3-7 days for surgery and recovery time.  We will have you complete a bowel prep prior to your surgery. Please see information provided.  You have also been given a (Blue) Pre-Care Sheet with more information regarding your particular surgery. Our surgery scheduler will call you to verify surgery date and to go over information.  Please review all information given.  You will need to arrange to be out of work for approximately 2 weeks and then you may return with a lifting restriction for 4 more weeks. If you have FMLA or Disability paperwork that needs to be filled out, please have your company fax your paperwork to [phone] or you may drop this by either office. This paperwork will be filled out within 3 days after your surgery has been completed.  Please call our office with any questions or concerns prior to your scheduled surgery.   Laparoscopic Colectomy Laparoscopic colectomy is surgery to remove part or all of the large intestine (colon). This procedure may be used to treat several conditions, including: Inflammation and infection of the colon (diverticulitis). Tumors or masses in the colon. Inflammatory bowel disease, such as Crohn disease or ulcerative colitis. Colectomy is an option when symptoms cannot be controlled with medicines. Bleeding from the colon that cannot be controlled by another method. Blockage or obstruction of the colon.  Tell a health care provider  about: Any allergies you have. All medicines you are taking, including vitamins, herbs, eye drops, creams, and over-the-counter medicines. Any problems you or family members have had with anesthetic medicines. Any blood disorders you have. Any surgeries you have had. Any medical conditions you have. What are the risks? Generally, this is a safe procedure. However, problems may occur, including: Infection. Bleeding. Allergic reactions to medicines or dyes. Damage to other structures or organs. Leaking from where the colon was sewn together. Future blockage of the small intestines from scar tissue. Another surgery may be needed to repair this. Needing to convert to an open procedure. Complications such as damage to other organs or excessive bleeding may require the surgeon to convert from a laparoscopic procedure to an open procedure. This involves making a larger incision in the abdomen.  What happens before the procedure?  Medicines Ask your health care provider about: Changing or stopping your regular medicines. This is especially important if you are taking diabetes medicines or blood thinners. Taking medicines such as aspirin and ibuprofen. These medicines can thin your blood. Do not take these medicines before your procedure if your health care provider instructs you not to. You may be given antibiotic medicine to clean out bacteria from your colon. Follow the directions carefully and take the medicine at the correct time. General instructions You may be prescribed an oral bowel prep to clean out your colon in preparation for the surgery: Follow instructions from your health care provider about how to do this.  Do not eat or drink anything else after you have started the bowel prep, unless your health care provider tells you it is safe to do so. Do not use any products that contain nicotine or tobacco, such as cigarettes and e-cigarettes. If you need help quitting, ask your health care  provider. What happens during the procedure? To reduce your risk of infection: Your health care team will wash or sanitize their hands. Your skin will be washed with soap. An IV tube will be inserted into one of your veins to deliver fluid and medication. You will be given one of the following: A medicine to help you relax (sedative). A medicine to make you fall asleep (general anesthetic). Small monitors will be connected to your body. They will be used to check your heart, blood pressure, and oxygen level. A breathing tube may be placed into your lungs during the procedure. A thin, flexible tube (catheter) will be placed into your bladder to drain urine. A tube may be placed through your nose and into your stomach to drain stomach fluids (nasogastric tube, or NG tube). Your abdomen will be filled with air so it expands. This gives the surgeon more room to operate and makes your organs easier to see. Several small cuts (incisions) will be made in your abdomen. A thin, lighted tube with a tiny camera on the end (laparoscope) will be put through one of the small incisions. The camera on the laparoscope will send a picture to a computer screen in the operating room. This will give the surgeon a good view inside your abdomen. Hollow tubes will be put through the other small incisions in your abdomen. The tools that are needed for the procedure will be put through these tubes. Clamps or staples will be put on both ends of the diseased part of the colon. The part of the intestine between the clamps or staples will be removed. If possible, the ends of the healthy colon that remain will be stitched (sutured) or stapled together to allow your body to pass waste (stool). Sometimes, the remaining colon cannot be stitched back together. If this is the case, a colostomy will be needed. If you need a colostomy: An opening to the outside of your body (stoma) will be made through your abdomen. The end of your  colon will be brought to the opening. It will be stitched to the skin. A bag will be attached to the opening. Stool will drain into this removable bag. The colostomy may be temporary or permanent. The incisions from the colectomy will be closed with sutures or staples. The procedure may vary among health care providers and hospitals. What happens after the procedure? Your blood pressure, heart rate, breathing rate, and blood oxygen level will be monitored until the medicines you were given have worn off. You will receive fluids through an IV tube until your bowels start to work properly. Once your bowels are working again, you will be given clear liquids first and then solid food as tolerated. You will be given medicines to control your pain and nausea, if needed. Do not drive for 24 hours if you were given a sedative. This information is not intended to replace advice given to you by your health care provider. Make sure you discuss any questions you have with your health care provider. Document Released: 03/21/2002 Document Revised: 09/30/2015 Document Reviewed: 09/30/2015 Elsevier Interactive Patient Education  2018 [employer].     Laparoscopic Colectomy, Care After This sheet gives  you information about how to care for yourself after your procedure. Your health care provider may also give you more specific instructions. If you have problems or questions, contact your health care provider. What can I expect after the procedure? After your procedure, it is common to have the following: Pain in your abdomen, especially in the incision areas. You will be given medicine to control the pain. Tiredness. This is a normal part of the recovery process. Your energy level will return to normal over the next several weeks. Changes in your bowel movements, such as constipation or needing to go more often. Talk with your health care provider about how to manage this.  Follow these instructions at  home: Medicines Take over-the-counter and prescription medicines only as told by your health care provider. Do not drive or use heavy machinery while taking prescription pain medicine. Do not drink alcohol while taking prescription pain medicine. If you were prescribed an antibiotic medicine, use it as told by your health care provider. Do not stop using the antibiotic even if you start to feel better. Incision care Follow instructions from your health care provider about how to take care of your incision areas. Make sure you: Keep your incisions clean and dry. Wash your hands with soap and water before and after applying medicine to the areas, and before and after changing your bandage (dressing). If soap and water are not available, use hand sanitizer. Change your dressing as told by your health care provider. Leave stitches (sutures), skin glue, or adhesive strips in place. These skin closures may need to stay in place for 2 weeks or longer. If adhesive strip edges start to loosen and curl up, you may trim the loose edges. Do not remove adhesive strips completely unless your health care provider tells you to do that. Do not wear tight clothing over the incisions. Tight clothing may rub and irritate the incision areas, which may cause the incisions to open. Do not take baths, swim, or use a hot tub until your health care provider approves. Ask your health care provider if you can take showers. You may only be allowed to take sponge baths for bathing. Check your incision area every day for signs of infection. Check for: More redness, swelling, or pain. More fluid or blood. Warmth. Pus or a bad smell. Activity Avoid lifting anything that is heavier than 10 lb (4.5 kg) for 2 weeks or until your health care provider says it is okay. You may resume normal activities as told by your health care provider. Ask your health care provider what activities are safe for you. Take rest breaks during the day  as needed. Eating and drinking Follow instructions from your health care provider about what you can eat after surgery. To prevent or treat constipation while you are taking prescription pain medicine, your health care provider may recommend that you: Drink enough fluid to keep your urine clear or pale yellow. Take over-the-counter or prescription medicines. Eat foods that are high in fiber, such as fresh fruits and vegetables, whole grains, and beans. Limit foods that are high in fat and processed sugars, such as fried and sweet foods. General instructions Ask your health care provider when you will need an appointment to get your sutures or staples removed. Keep all follow-up visits as told by your health care provider. This is important. Contact a health care provider if: You have more redness, swelling, or pain around your incisions. You have more fluid or blood coming  from the incisions. Your incisions feel warm to the touch. You have pus or a bad smell coming from your incisions or your dressing. You have a fever. You have an incision that breaks open (edges not staying together) after sutures or staples have been removed. Get help right away if: You develop a rash. You have chest pain or difficulty breathing. You have pain or swelling in your legs. You feel light-headed or you faint. Your abdomen swells (becomes distended). You have nausea or vomiting. You have blood in your stool (feces). This information is not intended to replace advice given to you by your health care provider. Make sure you discuss any questions you have with your health care provider. Document Released: 07/18/2004 Document Revised: 09/30/2015 Document Reviewed: 09/30/2015 Elsevier Interactive Patient Education  [employer].

## 2022-11-20 ENCOUNTER — Telehealth: Payer: Self-pay | Admitting: General Surgery

## 2022-11-20 ENCOUNTER — Ambulatory Visit: Payer: Self-pay | Admitting: General Surgery

## 2022-11-20 ENCOUNTER — Encounter: Payer: Self-pay | Admitting: Oncology

## 2022-11-20 ENCOUNTER — Inpatient Hospital Stay: Payer: 59 | Attending: Oncology | Admitting: Oncology

## 2022-11-20 ENCOUNTER — Other Ambulatory Visit: Payer: Self-pay

## 2022-11-20 ENCOUNTER — Inpatient Hospital Stay: Payer: 59

## 2022-11-20 VITALS — BP 124/96 | HR 67 | Temp 97.5°F | Resp 20 | Ht 64.0 in | Wt 128.0 lb

## 2022-11-20 DIAGNOSIS — C189 Malignant neoplasm of colon, unspecified: Secondary | ICD-10-CM

## 2022-11-20 DIAGNOSIS — Z803 Family history of malignant neoplasm of breast: Secondary | ICD-10-CM | POA: Diagnosis not present

## 2022-11-20 DIAGNOSIS — C183 Malignant neoplasm of hepatic flexure: Secondary | ICD-10-CM | POA: Diagnosis not present

## 2022-11-20 LAB — CBC WITH DIFFERENTIAL (CANCER CENTER ONLY)
Abs Immature Granulocytes: 0.01 10*3/uL (ref 0.00–0.07)
Basophils Absolute: 0 10*3/uL (ref 0.0–0.1)
Basophils Relative: 1 %
Eosinophils Absolute: 0.2 10*3/uL (ref 0.0–0.5)
Eosinophils Relative: 4 %
HCT: 40.2 % (ref 36.0–46.0)
Hemoglobin: 13.6 g/dL (ref 12.0–15.0)
Immature Granulocytes: 0 %
Lymphocytes Relative: 40 %
Lymphs Abs: 1.9 10*3/uL (ref 0.7–4.0)
MCH: 30.8 pg (ref 26.0–34.0)
MCHC: 33.8 g/dL (ref 30.0–36.0)
MCV: 91 fL (ref 80.0–100.0)
Monocytes Absolute: 0.2 10*3/uL (ref 0.1–1.0)
Monocytes Relative: 5 %
Neutro Abs: 2.4 10*3/uL (ref 1.7–7.7)
Neutrophils Relative %: 50 %
Platelet Count: 123 10*3/uL — ABNORMAL LOW (ref 150–400)
RBC: 4.42 MIL/uL (ref 3.87–5.11)
RDW: 13.2 % (ref 11.5–15.5)
WBC Count: 4.8 10*3/uL (ref 4.0–10.5)
nRBC: 0 % (ref 0.0–0.2)

## 2022-11-20 LAB — CMP (CANCER CENTER ONLY)
ALT: 42 U/L (ref 0–44)
AST: 45 U/L — ABNORMAL HIGH (ref 15–41)
Albumin: 3.8 g/dL (ref 3.5–5.0)
Alkaline Phosphatase: 104 U/L (ref 38–126)
Anion gap: 5 (ref 5–15)
BUN: 39 mg/dL — ABNORMAL HIGH (ref 8–23)
CO2: 28 mmol/L (ref 22–32)
Calcium: 8.9 mg/dL (ref 8.9–10.3)
Chloride: 103 mmol/L (ref 98–111)
Creatinine: 0.7 mg/dL (ref 0.44–1.00)
GFR, Estimated: >60 mL/min (ref >60)
Glucose, Bld: 94 mg/dL (ref 70–99)
Potassium: 4 mmol/L (ref 3.5–5.1)
Sodium: 136 mmol/L (ref 135–145)
Total Bilirubin: 0.5 mg/dL (ref <1.2)
Total Protein: 6.3 g/dL — ABNORMAL LOW (ref 6.5–8.1)

## 2022-11-20 NOTE — Telephone Encounter (Signed)
Patient has been advised of Pre-Admission date/time, and Surgery date at [facility].  Surgery Date: 12/02/22 Preadmission Testing Date: 11/24/22 (phone 8a-1p)  Patient has been made aware to call [phone], between 1-3:00pm the day before surgery, to find out what time to arrive for surgery.

## 2022-11-20 NOTE — Progress Notes (Signed)
Patient ID: Crystal Erickson, female   DOB: 30-Jun-1960, 62 y.o.   MRN: [id number] CC: Right Colon Cancer, hepatic flexure  History of Present Illness Crystal Erickson is a 62 y.o. female who presents in evaluation for newly diagnosed intramucosal adenocarcinoma of the hepatic flexure.  The patient had a routine screening colonoscopy recently that showed a 2 cm lesion that was biopsied and proven to be intramucosal adenocarcinoma.  The patient denies any recent bloody bowel movements, weight changes, abdominal pain or changes in her bowel habits.  She says that she is very healthy and works as a Psychologist, educational.  She denies any fevers or chills.  She has a history of colon cancer in her maternal grandfather but no first-degree relatives that have been diagnosed with colon cancer.  SHe has never had surgery before.  Past Medical History Past Medical History:  Diagnosis Date   Medical history non-contributory        Past Surgical History:  Procedure Laterality Date   COLONOSCOPY WITH PROPOFOL N/A 11/16/2022   Procedure: COLONOSCOPY WITH PROPOFOL;  Surgeon: Midge Minium, MD;  Location: [facility];  Service: Endoscopy;  Laterality: N/A;  SPOT injected at ulcerative mass transverse colon site    No Known Allergies  Current Outpatient Medications  Medication Sig Dispense Refill   ALPRAZolam (XANAX) 0.5 MG tablet Take 1 tablet (0.5 mg total) by mouth 2 (two) times daily as needed for anxiety or sleep. 30 tablet 2   Ascorbic Acid (VITAMIN C) 1000 MG tablet Take 1,000 mg by mouth daily.     b complex vitamins tablet Take 1 tablet by mouth 2 (two) times daily.       bisacodyl (DULCOLAX) 5 MG EC tablet Take all 4 tablets at 8 am the morning prior to your surgery. 4 tablet 0   cholecalciferol (VITAMIN D) 1000 UNITS tablet Take 1,000 Units by mouth daily.       Glucosamine-Chondroit-Vit C-Mn (GLUCOSAMINE 1500 COMPLEX PO) Take 1 capsule by mouth 2 (two) times daily.     GLUTAMINE PO Take 5 g by mouth daily.      metroNIDAZOLE (FLAGYL) 500 MG tablet Take 2 tablets at 8AM, take 2 tablets at [facility], and take 2 tablets at 8PM the day prior to your surgery 6 tablet 0   Multiple Vitamins-Minerals (MULTIVITAMIN PO) Take 1 tablet by mouth daily.     neomycin (MYCIFRADIN) 500 MG tablet Take 2 tablet at 8am, take 2 tablets at 2pm, and take 2 tablets at 8pm the day prior to your surgery 6 tablet 0   Omega-3 Fatty Acids (FISH OIL MAXIMUM STRENGTH) 1200 MG CAPS Take 1,200 mg by mouth 2 (two) times daily.     polyethylene glycol powder (MIRALAX) 17 GM/SCOOP powder Mix full container in 64 ounces of Gatorade or other clear liquid. NO Red 238 g 0   estradiol (ESTRACE) 0.1 MG/GM vaginal cream Place 1 g vaginally at bedtime. FOR 14 DAYS, then twice weekly thereafter (Patient not taking: Reported on 11/19/2022) 42.5 g 12   No current facility-administered medications for this visit.    Family History Family History  Problem Relation Age of Onset   Mental illness Mother 71       alzheimer's   Cancer Mother        metastatic unknown primary   Diabetes Father    Breast cancer Maternal Aunt        Social History Social History   Tobacco Use   Smoking status: Never  Passive exposure: Never   Smokeless tobacco: Never  Vaping Use   Vaping status: Never Used  Substance Use Topics   Alcohol use: Yes    Alcohol/week: 0.0 standard drinks of alcohol   Drug use: No        ROS Full ROS of systems performed and is otherwise negative there than what is stated in the HPI  Physical Exam Blood pressure 106/68, pulse 87, temperature 98 F (36.7 C), height 5\' 4"  (1.626 m), weight 125 lb (56.7 kg), last menstrual period 08/13/2010, SpO2 99%.  Well-appearing woman, in no acute distress, normal work of breathing on room air, alert and oriented x 3. Abdomen is soft, nondistended and nontender.  Appropriately nervous about recent cancer diagnosis  Pathology reviewed and consistent with intramucosal  adenocarcinoma   Data Reviewed I reviewed her colonoscopy images.  There is a lesion at the hepatic flexure that is approximately 2 cm large.  The colonoscopy report indicates that they did place a tattoo at this site.  Recent labs are notable for a normal BMP and her CBC is largely normal but she does have slightly low platelets at 128.  I have personally reviewed the patient's imaging and medical records.    Assessment    Crystal Erickson is a 62 year old otherwise healthy woma who was found to have an mucosal adenocarcinoma on recent screening colonoscopy.  She has been asymptomatic.  Plan  I discussed with the patient about the treatment of colon cancer.  For step will be for her to complete staging workup with a CT chest abdomen pelvis.  I will also have her get a CEA drawn.  If there is no evidence of metastatic disease and the recommendation is for her to proceed with surgical resection of her colon.  I discussed that we would perform a right colectomy with an ileocolostomy.  I discussed with her that her need for any postoperative therapy is contingent on pathology specifically invasion into the nodal basin and that we would have to wait till surgery to determine this.  I did instruct her that she will need to do a bowel prep prior to the procedure and that we will follow eras protocol.  I extensively discussed the risk, benefits and alternatives of the procedure including risk of infection, bleeding, damage to the small bowel or surrounding structures to include the ureter.  I also discussed that there is a risk of anastomotic leak.  I also discussed that there is a risk for need for an ostomy though it is low with right hemicolectomy's.  We will go ahead and schedule her for surgery pending screening workup.  I encouraged the patient to continue working out in the lead up to surgery.  We have instructed her on the medications needed for bowel prep.    Kandis Cocking 11/20/2022, 12:32 PM

## 2022-11-21 ENCOUNTER — Encounter: Payer: Self-pay | Admitting: Oncology

## 2022-11-21 NOTE — Progress Notes (Signed)
Hematology/Oncology Consult note [facility] Telephone:([phone] [phone]  Patient Care Team: Sherlene Shams, MD as PCP - General (Internal Medicine) Sherlene Shams, MD (Internal Medicine) Benita Gutter, RN as Oncology Nurse Navigator   Name of the patient: Crystal Erickson  [id number]  February 22, 1960    Reason for referral- new diagnosis of colon cancer   Referring physician-Dr. Servando Snare  Date of visit: 11/21/22   History of presenting illness-patient is a 62 year old female who underwent routine screening colonoscopy by Dr.Dr. Servando Snare on 11/16/2022.  Colonoscopy showed an ulcerating nonobstructive medium-sized mass at the hepatic flexure which was noncircumferential and measured 20 mm.  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  Although submucosal invasion is not identified in this biopsy the architectural complexity and cytologic atypia in conjunction with the endoscopy impression of the mass suggest that this could be adjacent to invasive component.Patient has been seen by Dr. Baker Pierini and his CT scan has been ordered for 11/24/2022.  Plan is for right hemicolectomy with or without ileocolostomy on 12/02/2022.  Patient denies any blood in her stool or urine.  Denies any personal or family history of colon cancer.  She feels well overall  ECOG PS- 0  Pain scale- 0   Review of systems- Review of Systems  Constitutional:  Negative for chills, fever, malaise/fatigue and weight loss.  HENT:  Negative for congestion, ear discharge and nosebleeds.   Eyes:  Negative for blurred vision.  Respiratory:  Negative for cough, hemoptysis, sputum production, shortness of breath and wheezing.   Cardiovascular:  Negative for chest pain, palpitations, orthopnea and claudication.  Gastrointestinal:  Negative for abdominal pain, blood in stool, constipation, diarrhea, heartburn, melena, nausea and vomiting.  Genitourinary:  Negative for dysuria,  flank pain, frequency, hematuria and urgency.  Musculoskeletal:  Negative for back pain, joint pain and myalgias.  Skin:  Negative for rash.  Neurological:  Negative for dizziness, tingling, focal weakness, seizures, weakness and headaches.  Endo/Heme/Allergies:  Does not bruise/bleed easily.  Psychiatric/Behavioral:  Negative for depression and suicidal ideas. The patient does not have insomnia.     No Known Allergies  Patient Active Problem List   Diagnosis Date Noted   Adenocarcinoma of colon (HCC) 11/20/2022   Digestive tumor 11/16/2022   Encounter for screening colonoscopy 11/16/2022   Post-menopause atrophic vaginitis 10/13/2022   Fatigue 10/13/2022   Varicose veins of leg with pain, bilateral 04/15/2021   Thrombocytopenia (HCC) 06/01/2013   Snoring 05/31/2013   Elevated liver enzymes 07/25/2012   Encounter for preventive health examination 07/24/2011   Screening for colon cancer 12/29/2010   History of Hashimoto thyroiditis 12/29/2010   Screening for cervical cancer 12/29/2010     Past Medical History:  Diagnosis Date   Colon cancer [facility])      Past Surgical History:  Procedure Laterality Date   COLONOSCOPY WITH PROPOFOL N/A 11/16/2022   Procedure: COLONOSCOPY WITH PROPOFOL;  Surgeon: Midge Minium, MD;  Location: [facility];  Service: Endoscopy;  Laterality: N/A;  SPOT injected at ulcerative mass transverse colon site    Social History   Socioeconomic History   Marital status: Single    Spouse name: Not on file   Number of children: Not on file   Years of education: Not on file   Highest education level: Not on file  Occupational History   Occupation: Systems analyst    Employer: [employer]  Tobacco Use   Smoking status: Never    Passive exposure: Never  Smokeless tobacco: Never  Vaping Use   Vaping status: Never Used  Substance and Sexual Activity   Alcohol use: Yes    Alcohol/week: 0.0 standard drinks of alcohol   Drug use: No   Sexual  activity: Yes  Other Topics Concern   Not on file  Social History Narrative   Not on file   Social Determinants of Health   Financial Resource Strain: Not on file  Food Insecurity: No Food Insecurity (11/20/2022)   Hunger Vital Sign    Worried About Running Out of Food in the Last Year: Never true    Ran Out of Food in the Last Year: Never true  Transportation Needs: No Transportation Needs (11/20/2022)   PRAPARE - Administrator, Civil Service (Medical): No    Lack of Transportation (Non-Medical): No  Physical Activity: Sufficiently Active (11/20/2022)   Exercise Vital Sign    Days of Exercise per Week: 7 days    Minutes of Exercise per Session: 60 min  Stress: No Stress Concern Present (11/20/2022)   [employer] of Occupational Health - Occupational Stress Questionnaire    Feeling of Stress : Not at all  Social Connections: Unknown (11/20/2022)   Social Connection and Isolation Panel [NHANES]    Frequency of Communication with Friends and Family: More than three times a week    Frequency of Social Gatherings with Friends and Family: More than three times a week    Attends Religious Services: Not on file    Active Member of Clubs or Organizations: Not on file    Attends Banker Meetings: Not on file    Marital Status: Married  Intimate Partner Violence: Not At Risk (11/20/2022)   Humiliation, Afraid, Rape, and Kick questionnaire    Fear of Current or Ex-Partner: No    Emotionally Abused: No    Physically Abused: No    Sexually Abused: No     Family History  Problem Relation Age of Onset   Mental illness Mother 75       alzheimer's   Cancer Mother        metastatic unknown primary   Diabetes Father    Breast cancer Maternal Aunt      Current Outpatient Medications:    ALPRAZolam (XANAX) 0.5 MG tablet, Take 1 tablet (0.5 mg total) by mouth 2 (two) times daily as needed for anxiety or sleep., Disp: 30 tablet, Rfl: 2   Ascorbic Acid (VITAMIN  C) 1000 MG tablet, Take 1,000 mg by mouth daily., Disp: , Rfl:    b complex vitamins tablet, Take 1 tablet by mouth 2 (two) times daily.  , Disp: , Rfl:    cholecalciferol (VITAMIN D) 1000 UNITS tablet, Take 1,000 Units by mouth daily.  , Disp: , Rfl:    Glucosamine-Chondroit-Vit C-Mn (GLUCOSAMINE 1500 COMPLEX PO), Take 1 capsule by mouth 2 (two) times daily., Disp: , Rfl:    GLUTAMINE PO, Take 5 g by mouth daily., Disp: , Rfl:    Multiple Vitamins-Minerals (MULTIVITAMIN PO), Take 1 tablet by mouth daily., Disp: , Rfl:    Omega-3 Fatty Acids (FISH OIL MAXIMUM STRENGTH) 1200 MG CAPS, Take 1,200 mg by mouth 2 (two) times daily., Disp: , Rfl:    bisacodyl (DULCOLAX) 5 MG EC tablet, Take all 4 tablets at 8 am the morning prior to your surgery. (Patient not taking: Reported on 11/20/2022), Disp: 4 tablet, Rfl: 0   estradiol (ESTRACE) 0.1 MG/GM vaginal cream, Place 1 g vaginally at bedtime.  FOR 14 DAYS, then twice weekly thereafter (Patient not taking: Reported on 11/19/2022), Disp: 42.5 g, Rfl: 12   Physical exam:  Vitals:   11/20/22 1334  BP: (!) 124/96  Pulse: 67  Resp: 20  Temp: (!) 97.5 F (36.4 C)  TempSrc: Tympanic  Weight: 128 lb (58.1 kg)  Height: 5\' 4"  (1.626 m)   Physical Exam Cardiovascular:     Rate and Rhythm: Normal rate and regular rhythm.     Heart sounds: Normal heart sounds.  Pulmonary:     Effort: Pulmonary effort is normal.     Breath sounds: Normal breath sounds.  Abdominal:     General: Bowel sounds are normal.     Palpations: Abdomen is soft.  Skin:    General: Skin is warm and dry.  Neurological:     Mental Status: She is alert and oriented to person, place, and time.           Latest Ref Rng & Units 11/20/2022    2:30 PM  CMP  Glucose 70 - 99 mg/dL 94   BUN 8 - 23 mg/dL 39   Creatinine 6.57 - 1.00 mg/dL 8.46   Sodium 962 - 952 mmol/L 136   Potassium 3.5 - 5.1 mmol/L 4.0   Chloride 98 - 111 mmol/L 103   CO2 22 - 32 mmol/L 28   Calcium 8.9 - 10.3  mg/dL 8.9   Total Protein 6.5 - 8.1 g/dL 6.3   Total Bilirubin <8.4 mg/dL 0.5   Alkaline Phos 38 - 126 U/L 104   AST 15 - 41 U/L 45   ALT 0 - 44 U/L 42       Latest Ref Rng & Units 11/20/2022    2:29 PM  CBC  WBC 4.0 - 10.5 K/uL 4.8   Hemoglobin 12.0 - 15.0 g/dL 13.2   Hematocrit 44.0 - 46.0 % 40.2   Platelets 150 - 400 K/uL 123     No images are attached to the encounter.  MM 3D SCREENING MAMMOGRAM BILATERAL BREAST  Result Date: 11/17/2022 CLINICAL DATA:  Screening. EXAM: DIGITAL SCREENING BILATERAL MAMMOGRAM WITH TOMOSYNTHESIS AND CAD TECHNIQUE: Bilateral screening digital craniocaudal and mediolateral oblique mammograms were obtained. Bilateral screening digital breast tomosynthesis was performed. The images were evaluated with computer-aided detection. COMPARISON:  Previous exam(s). ACR Breast Density Category b: There are scattered areas of fibroglandular density. FINDINGS: There are no findings suspicious for malignancy. IMPRESSION: No mammographic evidence of malignancy. A result letter of this screening mammogram will be mailed directly to the patient. RECOMMENDATION: Screening mammogram in one year. (Code:SM-B-01Y) BI-RADS CATEGORY  1: Negative. Electronically Signed   By: Elberta Fortis M.D.   On: 11/17/2022 16:52    Assessment and plan- Patient is a 63 y.o. female with new diagnosis of colon cancer  Discussed with the patient different stages of colon cancer.  CT scan will be necessary to determine that there is no evidence of metastatic disease.  Once that is confirmed patient will proceed with right hemicolectomy.  I will see her 2 weeks post surgery to discuss final pathology results and further management.  If she has stage III colon cancer she would benefit from adjuvant chemotherapy.  Chemotherapy may be indicated in some cases with stage II colon cancer.  Adjuvant chemotherapy not indicated for stage I colon cancer.  After the diagnosis of colon cancer patient will need  surveillance endoscopy 1 year after diagnosis followed by 3 years and then every 5 years.  I will  refer her to genetic counseling after surgery.  Patient comprehends my plan well.  I will check CBC with differential CMP CEA today.  Discussed pros and cons of Signatera testing and patient does not wish to proceed with that postsurgery   Thank you for this kind referral and the opportunity to participate in the care of this patient   Visit Diagnosis 1. Adenocarcinoma of colon [facility])     Dr. Owens Shark, MD, MPH [facility] [phone] 11/21/2022

## 2022-11-22 LAB — CEA: CEA: 3.7 ng/mL (ref 0.0–4.7)

## 2022-11-24 ENCOUNTER — Telehealth: Payer: Self-pay | Admitting: General Surgery

## 2022-11-24 ENCOUNTER — Encounter: Payer: Self-pay | Admitting: Urgent Care

## 2022-11-24 ENCOUNTER — Encounter
Admission: RE | Admit: 2022-11-24 | Discharge: 2022-11-24 | Disposition: A | Discharge status: Home / Self Care | Payer: 59 | Source: Ambulatory Visit | Attending: General Surgery | Admitting: General Surgery

## 2022-11-24 ENCOUNTER — Other Ambulatory Visit: Payer: Self-pay

## 2022-11-24 ENCOUNTER — Ambulatory Visit
Admission: RE | Admit: 2022-11-24 | Discharge: 2022-11-24 | Disposition: A | Discharge status: Home / Self Care | Payer: 59 | Source: Ambulatory Visit | Attending: General Surgery | Admitting: General Surgery

## 2022-11-24 VITALS — Ht 64.0 in | Wt 123.0 lb

## 2022-11-24 DIAGNOSIS — Z01818 Encounter for other preprocedural examination: Secondary | ICD-10-CM | POA: Insufficient documentation

## 2022-11-24 DIAGNOSIS — C189 Malignant neoplasm of colon, unspecified: Secondary | ICD-10-CM | POA: Insufficient documentation

## 2022-11-24 LAB — TYPE AND SCREEN
ABO/RH(D): O POS
Antibody Screen: NEGATIVE

## 2022-11-24 MED ORDER — IOHEXOL 300 MG/ML  SOLN
100.0000 mL | Freq: Once | INTRAMUSCULAR | Status: AC | PRN
  Administered 2022-11-24: 100 mL via INTRAVENOUS

## 2022-11-24 NOTE — Patient Instructions (Addendum)
Your procedure is scheduled on: Wednesday 12/02/22 To find out your arrival time, please call [phone] between 1PM - 3PM on:  Tuesday 12/01/22  Report to the Registration Desk on the 1st floor of the [facility]. Free Valet parking is available.  If your arrival time is 6:00 am, do not arrive before that time as the [facility] entrance doors do not open until 6:00 am.  REMEMBER: Instructions that are not followed completely may result in serious medical risk, up to and including death; or upon the discretion of your surgeon and anesthesiologist your surgery may need to be rescheduled.  Do not eat food or drink liquids after midnight the night before surgery.  No gum chewing or hard candies.  Bowel prep as per Dr Maurine Minister  Stop ANY OVER THE COUNTER supplements and vitamins until after surgery.  TAKE ONLY THESE MEDICATIONS THE MORNING OF SURGERY WITH A SIP OF WATER:  none  No Alcohol for 24 hours before or after surgery.  No Smoking including e-cigarettes for 24 hours before surgery.  No chewable tobacco products for at least 6 hours before surgery.  No nicotine patches on the day of surgery.  Do not use any "recreational" drugs for at least a week (preferably 2 weeks) before your surgery.  Please be advised that the combination of cocaine and anesthesia may have negative outcomes, up to and including death. If you test positive for cocaine, your surgery will be cancelled.  On the morning of surgery brush your teeth with toothpaste and water, you may rinse your mouth with mouthwash if you wish. Do not swallow any toothpaste or mouthwash.  Use CHG Soap or wipes as directed on instruction sheet.  Do not wear lotions, powders, or perfumes.   Do not shave body hair from the neck down 48 hours before surgery.  Wear comfortable clothing (specific to your surgery type) to the hospital.  Do not wear jewelry, make-up, hairpins, clips or nail polish.  For welded (permanent)  jewelry: bracelets, anklets, waist bands, etc.  Please have this removed prior to surgery.  If it is not removed, there is a chance that hospital personnel will need to cut it off on the day of surgery. Contact lenses, hearing aids and dentures may not be worn into surgery.  Do not bring valuables to the hospital. [facility] is not responsible for any missing/lost belongings or valuables.   Notify your doctor if there is any change in your medical condition (cold, fever, infection).  If you are being discharged the day of surgery, you will not be allowed to drive home. You will need a responsible individual to drive you home and stay with you for 24 hours after surgery.   If you are taking public transportation, you will need to have a responsible individual with you.     Preparing for Surgery with CHLORHEXIDINE GLUCONATE (CHG) Soap  Chlorhexidine Gluconate (CHG) Soap  o An antiseptic cleaner that kills germs and bonds with the skin to continue killing germs even after washing  o Used for showering the night before surgery and morning of surgery  Before surgery, you can play an important role by reducing the number of germs on your skin.  CHG (Chlorhexidine gluconate) soap is an antiseptic cleanser which kills germs and bonds with the skin to continue killing germs even after washing.  Please do not use if you have an allergy to CHG or antibacterial soaps. If your skin becomes reddened/irritated stop using the CHG.  1. Shower the NIGHT BEFORE SURGERY and the MORNING OF SURGERY with CHG soap.  2. If you choose to wash your hair, wash your hair first as usual with your normal shampoo.  3. After shampooing, rinse your hair and body thoroughly to remove the shampoo.  4. Use CHG as you would any other liquid soap. You can apply CHG directly to the skin and wash gently with a scrungie or a clean washcloth.  5. Apply the CHG soap to your body only from the neck down. Do not use on open  wounds or open sores. Avoid contact with your eyes, ears, mouth, and genitals (private parts). Wash face and genitals (private parts) with your normal soap.  6. Wash thoroughly, paying special attention to the area where your surgery will be performed.  7. Thoroughly rinse your body with warm water.  8. Do not shower/wash with your normal soap after using and rinsing off the CHG soap.  9. Pat yourself dry with a clean towel.  10. Wear clean pajamas to bed the night before surgery.  12. Place clean sheets on your bed the night of your first shower and do not sleep with pets.  13. Shower again with the CHG soap on the day of surgery prior to arriving at the hospital.  14. Do not apply any deodorants/lotions/powders.  15. Please wear clean clothes to the hospital.  If you are being admitted to the hospital overnight, leave your suitcase in the car. After surgery it may be brought to your room.  In case of increased patient census, it may be necessary for you, the patient, to continue your postoperative care in the Same Day Surgery department.  After surgery, you can help prevent lung complications by doing breathing exercises.  Take deep breaths and cough every 1-2 hours. Your doctor may order a device called an Incentive Spirometer to help you take deep breaths. When coughing or sneezing, hold a pillow firmly against your incision with both hands. This is called "splinting." Doing this helps protect your incision. It also decreases belly discomfort.  Surgery Visitation Policy:  Patients undergoing a surgery or procedure may have two family members or support persons with them as long as the person is not COVID-19 positive or experiencing its symptoms.   Inpatient Visitation:    Visiting hours are 7 a.m. to 8 p.m. Up to four visitors are allowed at one time in a patient room. The visitors may rotate out with other people during the day. One designated support person (adult) may remain  overnight.  Please call the Pre-admissions Testing Dept. at [phone] if you have any questions about these instructions.

## 2022-11-24 NOTE — Telephone Encounter (Signed)
Dr. Maurine Minister, Patient is scheduled for surgery 12/02/22.  She just saw Dr. Smith Robert and has some concerns, questions.  Asking to speak with you directly.  Please call her @ [phone].  She is due to be at preadmit @ 3:00 pm and CT scan @5 :30 today.  So you may not be able to get in touch with her during these times.  Thank you.

## 2022-12-01 MED ORDER — CHLORHEXIDINE GLUCONATE CLOTH 2 % EX PADS: 6.0000 | MEDICATED_PAD | Freq: Once | CUTANEOUS | Status: DC

## 2022-12-01 MED ORDER — ALVIMOPAN 12 MG PO CAPS
12.0000 mg | ORAL_CAPSULE | ORAL | Status: AC
  Administered 2022-12-02: 12 mg via ORAL

## 2022-12-01 MED ORDER — ORAL CARE MOUTH RINSE: 15.0000 mL | Freq: Once | OROMUCOSAL | Status: AC

## 2022-12-01 MED ORDER — CHLORHEXIDINE GLUCONATE 0.12 % MT SOLN
15.0000 mL | Freq: Once | OROMUCOSAL | Status: AC
  Administered 2022-12-02: 15 mL via OROMUCOSAL

## 2022-12-01 MED ORDER — GABAPENTIN 300 MG PO CAPS
300.0000 mg | ORAL_CAPSULE | ORAL | Status: AC
  Administered 2022-12-02: 300 mg via ORAL

## 2022-12-01 MED ORDER — INDOCYANINE GREEN 25 MG IV SOLR
7.5000 mg | Freq: Once | INTRAVENOUS | Status: DC
Start: 2022-12-01 — End: 2022-12-02
  Filled 2022-12-01: qty 10

## 2022-12-01 MED ORDER — ACETAMINOPHEN 500 MG PO TABS
1000.0000 mg | ORAL_TABLET | ORAL | Status: AC
  Administered 2022-12-02: 1000 mg via ORAL

## 2022-12-01 MED ORDER — LACTATED RINGERS IV SOLN: INTRAVENOUS | Status: DC

## 2022-12-01 MED ORDER — CHLORHEXIDINE GLUCONATE CLOTH 2 % EX PADS
6.0000 | MEDICATED_PAD | Freq: Once | CUTANEOUS | Status: AC
  Administered 2022-12-02: 6 via TOPICAL

## 2022-12-01 MED ORDER — SODIUM CHLORIDE 0.9 % IV SOLN
1.0000 g | INTRAVENOUS | Status: AC
  Administered 2022-12-02: 1 g via INTRAVENOUS
  Filled 2022-12-01: qty 1

## 2022-12-01 MED ORDER — CELECOXIB 200 MG PO CAPS
200.0000 mg | ORAL_CAPSULE | ORAL | Status: AC
  Administered 2022-12-02: 200 mg via ORAL

## 2022-12-01 MED ORDER — ENOXAPARIN SODIUM 40 MG/0.4ML IJ SOSY
40.0000 mg | PREFILLED_SYRINGE | Freq: Once | INTRAMUSCULAR | Status: AC
  Administered 2022-12-02: 40 mg via SUBCUTANEOUS

## 2022-12-02 ENCOUNTER — Other Ambulatory Visit: Payer: Self-pay

## 2022-12-02 ENCOUNTER — Inpatient Hospital Stay: Payer: Self-pay | Admitting: Anesthesiology

## 2022-12-02 ENCOUNTER — Inpatient Hospital Stay: Payer: 59 | Admitting: Anesthesiology

## 2022-12-02 ENCOUNTER — Encounter: Payer: Self-pay | Admitting: General Surgery

## 2022-12-02 ENCOUNTER — Encounter
Admission: RE | Disposition: A | Discharge status: Home / Self Care | Payer: Self-pay | Source: Home / Self Care | Attending: General Surgery

## 2022-12-02 ENCOUNTER — Inpatient Hospital Stay
Admission: RE | Admit: 2022-12-02 | Discharge: 2022-12-04 | DRG: 331 | Disposition: A | Discharge status: Home / Self Care | Payer: 59 | Attending: General Surgery | Admitting: General Surgery

## 2022-12-02 DIAGNOSIS — Z01810 Encounter for preprocedural cardiovascular examination: Secondary | ICD-10-CM | POA: Diagnosis not present

## 2022-12-02 DIAGNOSIS — Z79899 Other long term (current) drug therapy: Secondary | ICD-10-CM

## 2022-12-02 DIAGNOSIS — C183 Malignant neoplasm of hepatic flexure: Principal | ICD-10-CM | POA: Diagnosis present

## 2022-12-02 DIAGNOSIS — C189 Malignant neoplasm of colon, unspecified: Principal | ICD-10-CM | POA: Diagnosis present

## 2022-12-02 HISTORY — PX: COLON SURGERY: SHX602

## 2022-12-02 LAB — ABO/RH: ABO/RH(D): O POS

## 2022-12-02 SURGERY — COLECTOMY, RIGHT, ROBOT-ASSISTED
Anesthesia: General | Site: Abdomen

## 2022-12-02 MED ORDER — FENTANYL CITRATE (PF) 100 MCG/2ML IJ SOLN
INTRAMUSCULAR | Status: AC
  Filled 2022-12-02: qty 2

## 2022-12-02 MED ORDER — SUGAMMADEX SODIUM 200 MG/2ML IV SOLN
INTRAVENOUS | Status: DC | PRN
  Administered 2022-12-02: 200 mg via INTRAVENOUS

## 2022-12-02 MED ORDER — ACETAMINOPHEN 500 MG PO TABS
1000.0000 mg | ORAL_TABLET | Freq: Four times a day (QID) | ORAL | Status: DC
  Administered 2022-12-02 – 2022-12-04 (×8): 1000 mg via ORAL
  Filled 2022-12-02 (×9): qty 2

## 2022-12-02 MED ORDER — PROPOFOL 500 MG/50ML IV EMUL
INTRAVENOUS | Status: DC | PRN
  Administered 2022-12-02: 20 ug/kg/min via INTRAVENOUS

## 2022-12-02 MED ORDER — SODIUM CHLORIDE 0.9 % IR SOLN
Status: DC | PRN
Start: 2022-12-02 — End: 2022-12-02
  Administered 2022-12-02: 1

## 2022-12-02 MED ORDER — FENTANYL CITRATE (PF) 100 MCG/2ML IJ SOLN
INTRAMUSCULAR | Status: DC | PRN
  Administered 2022-12-02 (×2): 50 ug via INTRAVENOUS

## 2022-12-02 MED ORDER — KCL IN DEXTROSE-NACL 20-5-0.45 MEQ/L-%-% IV SOLN
INTRAVENOUS | Status: DC
Start: 2022-12-02 — End: 2022-12-03
  Filled 2022-12-02 (×2): qty 1000

## 2022-12-02 MED ORDER — PROPOFOL 10 MG/ML IV BOLUS
INTRAVENOUS | Status: DC | PRN
  Administered 2022-12-02: 100 mg via INTRAVENOUS

## 2022-12-02 MED ORDER — MORPHINE SULFATE (PF) 2 MG/ML IV SOLN: 2.0000 mg | INTRAVENOUS | Status: DC | PRN

## 2022-12-02 MED ORDER — MIDAZOLAM HCL 2 MG/2ML IJ SOLN
INTRAMUSCULAR | Status: AC
Start: 2022-12-02 — End: ?
  Filled 2022-12-02: qty 2

## 2022-12-02 MED ORDER — SPY AGENT GREEN - (INDOCYANINE FOR INJECTION)
INTRAMUSCULAR | Status: DC | PRN
  Administered 2022-12-02: 3 mL via INTRAVENOUS

## 2022-12-02 MED ORDER — ALVIMOPAN 12 MG PO CAPS
12.0000 mg | ORAL_CAPSULE | Freq: Two times a day (BID) | ORAL | Status: DC
  Administered 2022-12-03 (×2): 12 mg via ORAL
  Filled 2022-12-02 (×3): qty 1

## 2022-12-02 MED ORDER — OXYCODONE HCL 5 MG PO TABS: 10.0000 mg | ORAL_TABLET | ORAL | Status: DC | PRN

## 2022-12-02 MED ORDER — FENTANYL CITRATE (PF) 100 MCG/2ML IJ SOLN
25.0000 ug | INTRAMUSCULAR | Status: DC | PRN
  Administered 2022-12-02: 25 ug via INTRAVENOUS

## 2022-12-02 MED ORDER — GABAPENTIN 300 MG PO CAPS
ORAL_CAPSULE | ORAL | Status: AC
  Filled 2022-12-02: qty 1

## 2022-12-02 MED ORDER — PHENYLEPHRINE HCL-NACL 20-0.9 MG/250ML-% IV SOLN
INTRAVENOUS | Status: DC | PRN
  Administered 2022-12-02: 10 ug/min via INTRAVENOUS

## 2022-12-02 MED ORDER — ONDANSETRON HCL 4 MG/2ML IJ SOLN
INTRAMUSCULAR | Status: AC
  Filled 2022-12-02: qty 2

## 2022-12-02 MED ORDER — ENOXAPARIN SODIUM 40 MG/0.4ML IJ SOSY
PREFILLED_SYRINGE | INTRAMUSCULAR | Status: AC
  Filled 2022-12-02: qty 0.4

## 2022-12-02 MED ORDER — LIDOCAINE HCL (CARDIAC) PF 100 MG/5ML IV SOSY
PREFILLED_SYRINGE | INTRAVENOUS | Status: DC | PRN
  Administered 2022-12-02: 80 mg via INTRAVENOUS

## 2022-12-02 MED ORDER — OXYCODONE HCL 5 MG/5ML PO SOLN: 5.0000 mg | Freq: Once | ORAL | Status: DC | PRN

## 2022-12-02 MED ORDER — ROCURONIUM BROMIDE 10 MG/ML (PF) SYRINGE
PREFILLED_SYRINGE | INTRAVENOUS | Status: AC
  Filled 2022-12-02: qty 10

## 2022-12-02 MED ORDER — OXYCODONE HCL 5 MG PO TABS: 5.0000 mg | ORAL_TABLET | Freq: Once | ORAL | Status: DC | PRN

## 2022-12-02 MED ORDER — DEXAMETHASONE SODIUM PHOSPHATE 10 MG/ML IJ SOLN
INTRAMUSCULAR | Status: DC | PRN
  Administered 2022-12-02: 10 mg via INTRAVENOUS

## 2022-12-02 MED ORDER — BUPIVACAINE-EPINEPHRINE (PF) 0.25% -1:200000 IJ SOLN
INTRAMUSCULAR | Status: AC
  Filled 2022-12-02: qty 30

## 2022-12-02 MED ORDER — ENOXAPARIN SODIUM 40 MG/0.4ML IJ SOSY
40.0000 mg | PREFILLED_SYRINGE | INTRAMUSCULAR | Status: DC
Start: 2022-12-03 — End: 2022-12-04
  Administered 2022-12-03 – 2022-12-04 (×2): 40 mg via SUBCUTANEOUS
  Filled 2022-12-02 (×2): qty 0.4

## 2022-12-02 MED ORDER — SODIUM CHLORIDE 0.9 % IV SOLN
2.0000 g | Freq: Two times a day (BID) | INTRAVENOUS | Status: AC
  Administered 2022-12-02: 2 g via INTRAVENOUS
  Filled 2022-12-02: qty 2

## 2022-12-02 MED ORDER — ACETAMINOPHEN 500 MG PO TABS
ORAL_TABLET | ORAL | Status: AC
  Filled 2022-12-02: qty 2

## 2022-12-02 MED ORDER — CHLORHEXIDINE GLUCONATE 0.12 % MT SOLN
OROMUCOSAL | Status: AC
Start: 2022-12-02 — End: ?
  Filled 2022-12-02: qty 15

## 2022-12-02 MED ORDER — ENSURE SURGERY PO LIQD
237.0000 mL | Freq: Two times a day (BID) | ORAL | Status: DC
  Administered 2022-12-03 – 2022-12-04 (×3): 237 mL via ORAL
  Filled 2022-12-02: qty 237

## 2022-12-02 MED ORDER — PROPOFOL 10 MG/ML IV BOLUS
INTRAVENOUS | Status: AC
  Filled 2022-12-02: qty 20

## 2022-12-02 MED ORDER — ALVIMOPAN 12 MG PO CAPS
ORAL_CAPSULE | ORAL | Status: AC
  Filled 2022-12-02: qty 1

## 2022-12-02 MED ORDER — PHENYLEPHRINE HCL-NACL 20-0.9 MG/250ML-% IV SOLN
INTRAVENOUS | Status: AC
  Filled 2022-12-02: qty 250

## 2022-12-02 MED ORDER — ROCURONIUM BROMIDE 100 MG/10ML IV SOLN
INTRAVENOUS | Status: DC | PRN
  Administered 2022-12-02: 60 mg via INTRAVENOUS
  Administered 2022-12-02: 10 mg via INTRAVENOUS
  Administered 2022-12-02 (×2): 30 mg via INTRAVENOUS
  Administered 2022-12-02: 40 mg via INTRAVENOUS
  Administered 2022-12-02: 10 mg via INTRAVENOUS
  Administered 2022-12-02: 30 mg via INTRAVENOUS

## 2022-12-02 MED ORDER — EPHEDRINE SULFATE-NACL 50-0.9 MG/10ML-% IV SOSY
PREFILLED_SYRINGE | INTRAVENOUS | Status: DC | PRN
  Administered 2022-12-02: 5 mg via INTRAVENOUS
  Administered 2022-12-02: 10 mg via INTRAVENOUS

## 2022-12-02 MED ORDER — SODIUM CHLORIDE (PF) 0.9 % IJ SOLN
INTRAMUSCULAR | Status: DC | PRN
  Administered 2022-12-02: 80 mL

## 2022-12-02 MED ORDER — BUPIVACAINE-EPINEPHRINE (PF) 0.5% -1:200000 IJ SOLN
INTRAMUSCULAR | Status: AC
  Filled 2022-12-02: qty 10

## 2022-12-02 MED ORDER — ALBUMIN HUMAN 5 % IV SOLN
INTRAVENOUS | Status: AC
  Filled 2022-12-02: qty 250

## 2022-12-02 MED ORDER — DEXAMETHASONE SODIUM PHOSPHATE 10 MG/ML IJ SOLN
INTRAMUSCULAR | Status: AC
  Filled 2022-12-02: qty 1

## 2022-12-02 MED ORDER — ALBUMIN HUMAN 5 % IV SOLN: INTRAVENOUS | Status: DC | PRN

## 2022-12-02 MED ORDER — CELECOXIB 200 MG PO CAPS
ORAL_CAPSULE | ORAL | Status: AC
  Filled 2022-12-02: qty 1

## 2022-12-02 MED ORDER — BUPIVACAINE LIPOSOME 1.3 % IJ SUSP
INTRAMUSCULAR | Status: AC
  Filled 2022-12-02: qty 20

## 2022-12-02 MED ORDER — OXYCODONE HCL 5 MG PO TABS
5.0000 mg | ORAL_TABLET | ORAL | Status: DC | PRN
  Administered 2022-12-03 (×2): 5 mg via ORAL
  Filled 2022-12-02 (×2): qty 1

## 2022-12-02 MED ORDER — ONDANSETRON HCL 4 MG/2ML IJ SOLN
INTRAMUSCULAR | Status: DC | PRN
  Administered 2022-12-02: 4 mg via INTRAVENOUS

## 2022-12-02 MED ORDER — PHENYLEPHRINE 80 MCG/ML (10ML) SYRINGE FOR IV PUSH (FOR BLOOD PRESSURE SUPPORT)
PREFILLED_SYRINGE | INTRAVENOUS | Status: DC | PRN
  Administered 2022-12-02: 80 ug via INTRAVENOUS

## 2022-12-02 MED ORDER — MIDAZOLAM HCL 2 MG/2ML IJ SOLN
INTRAMUSCULAR | Status: DC | PRN
  Administered 2022-12-02: 2 mg via INTRAVENOUS

## 2022-12-02 MED ORDER — LIDOCAINE HCL (PF) 2 % IJ SOLN
INTRAMUSCULAR | Status: AC
  Filled 2022-12-02: qty 5

## 2022-12-02 MED ORDER — METHOCARBAMOL 500 MG PO TABS
500.0000 mg | ORAL_TABLET | Freq: Three times a day (TID) | ORAL | Status: DC
  Administered 2022-12-02 – 2022-12-04 (×6): 500 mg via ORAL
  Filled 2022-12-02 (×6): qty 1

## 2022-12-02 MED ORDER — SODIUM CHLORIDE FLUSH 0.9 % IV SOLN
INTRAVENOUS | Status: AC
  Filled 2022-12-02: qty 40

## 2022-12-02 SURGICAL SUPPLY — 60 items
APPLIER CLIP 5 13 M/L LIGAMAX5 (MISCELLANEOUS) ×2
CANNULA CAP OBTURATR AIRSEAL 8 (CAP) IMPLANT
CANNULA REDUCER 12-8 DVNC XI (CANNULA) ×2 IMPLANT
CLIP APPLIE 5 13 M/L LIGAMAX5 (MISCELLANEOUS) IMPLANT
COVER TIP SHEARS 8 DVNC (MISCELLANEOUS) ×2 IMPLANT
DERMABOND ADVANCED .7 DNX12 (GAUZE/BANDAGES/DRESSINGS) ×2 IMPLANT
DRAPE ARM DVNC X/XI (DISPOSABLE) ×8 IMPLANT
DRAPE COLUMN DVNC XI (DISPOSABLE) ×2 IMPLANT
DRIVER NDL MEGA 8 DVNC XI (INSTRUMENTS) IMPLANT
DRIVER NDLE MEGA DVNC XI (INSTRUMENTS) ×2
ELECT REM PT RETURN 9FT ADLT (ELECTROSURGICAL) ×2
ELECTRODE REM PT RTRN 9FT ADLT (ELECTROSURGICAL) ×2 IMPLANT
FORCEPS BPLR R/ABLATION 8 DVNC (INSTRUMENTS) ×2 IMPLANT
GLOVE BIOGEL PI IND STRL 7.5 (GLOVE) ×4 IMPLANT
GLOVE SURG SYN 7.0 (GLOVE) ×4
GLOVE SURG SYN 7.0 PF PI (GLOVE) ×4 IMPLANT
GOWN STRL REUS W/ TWL LRG LVL3 (GOWN DISPOSABLE) ×12 IMPLANT
GRASPER TIP-UP FEN DVNC XI (INSTRUMENTS) ×2 IMPLANT
HANDLE YANKAUER SUCT BULB TIP (MISCELLANEOUS) ×2 IMPLANT
IRRIGATION STRYKERFLOW (MISCELLANEOUS) ×2 IMPLANT
IRRIGATOR STRYKERFLOW (MISCELLANEOUS) ×2
IV NS 1000ML BAXH (IV SOLUTION) ×2 IMPLANT
KIT IMAGING PINPOINTPAQ (MISCELLANEOUS) ×2 IMPLANT
KIT PINK PAD W/HEAD ARE REST (MISCELLANEOUS) ×2
KIT PINK PAD W/HEAD ARM REST (MISCELLANEOUS) ×2 IMPLANT
LABEL OR SOLS (LABEL) ×2 IMPLANT
MANIFOLD NEPTUNE II (INSTRUMENTS) ×2 IMPLANT
NDL DRIVE SUT CUT DVNC (INSTRUMENTS) ×2 IMPLANT
NDL HYPO 22X1.5 SAFETY MO (MISCELLANEOUS) ×2 IMPLANT
NEEDLE DRIVE SUT CUT DVNC (INSTRUMENTS) ×2
NEEDLE HYPO 22X1.5 SAFETY MO (MISCELLANEOUS) ×2
OBTURATOR OPTICAL STND 8 DVNC (TROCAR) ×2
OBTURATOR OPTICALSTD 8 DVNC (TROCAR) ×2 IMPLANT
PACK COLON CLEAN CLOSURE (MISCELLANEOUS) ×2 IMPLANT
PACK LAP CHOLECYSTECTOMY (MISCELLANEOUS) ×2 IMPLANT
PENCIL SMOKE EVACUATOR COATED (MISCELLANEOUS) ×2 IMPLANT
RELOAD STAPLE 60 2.5 WHT DVNC (STAPLE) IMPLANT
RELOAD STAPLE 60 3.5 BLU DVNC (STAPLE) ×6 IMPLANT
SCISSORS MNPLR CVD DVNC XI (INSTRUMENTS) ×2 IMPLANT
SEAL UNIV 5-12 XI (MISCELLANEOUS) ×8 IMPLANT
SEALER VESSEL EXT DVNC XI (MISCELLANEOUS) ×2 IMPLANT
SET TUBE FILTERED XL AIRSEAL (SET/KITS/TRAYS/PACK) IMPLANT
SET TUBE SMOKE EVAC HIGH FLOW (TUBING) IMPLANT
SOL ELECTROSURG ANTI STICK (MISCELLANEOUS) ×2
SOLUTION ELECTROSURG ANTI STCK (MISCELLANEOUS) ×2 IMPLANT
SPONGE T-LAP 18X18 ~~LOC~~+RFID (SPONGE) ×2 IMPLANT
SPONGE T-LAP 4X18 ~~LOC~~+RFID (SPONGE) ×2 IMPLANT
STAPLER 60 SUREFORM DVNC (STAPLE) ×2 IMPLANT
STAPLER RELOAD 2.5X60 WHT DVNC (STAPLE)
STAPLER RELOAD 3.5X60 BLU DVNC (STAPLE) ×6
SUT MNCRL 4-0 27XMFL (SUTURE) ×4
SUT PDS AB 0 CT1 27 (SUTURE) ×4 IMPLANT
SUT STRATA PDS 2-0 23 CT-2.5 (SUTURE) ×4
SUT VIC AB 2-0 SH 27XBRD (SUTURE) ×6 IMPLANT
SUTURE MNCRL 4-0 27XMF (SUTURE) ×4 IMPLANT
SUTURE STRAT PDS 2-0 23 CT-2.5 (SUTURE) IMPLANT
SYR 20ML LL LF (SYRINGE) ×2 IMPLANT
SYS LAPSCP GELPORT 120MM (MISCELLANEOUS) ×2
SYSTEM LAPSCP GELPORT 120MM (MISCELLANEOUS) ×2 IMPLANT
TRAY FOLEY MTR SLVR 16FR STAT (SET/KITS/TRAYS/PACK) ×2 IMPLANT

## 2022-12-02 NOTE — Plan of Care (Signed)
  Problem: Clinical Measurements: Goal: Respiratory complications will improve Outcome: Progressing   Problem: Activity: Goal: Risk for activity intolerance will decrease 12/02/2022 1937 by Hampton Abbot, RN Outcome: Progressing 12/02/2022 1936 by Hampton Abbot, RN Outcome: Progressing

## 2022-12-02 NOTE — Transfer of Care (Signed)
 Immediate Anesthesia Transfer of Care Note  Patient: Crystal Erickson  Procedure(s) Performed: XI ROBOT ASSISTED RIGHT Hemicolectomy, RNFA to assist (Abdomen) INDOCYANINE GREEN FLUORESCENCE IMAGING (ICG)  Patient Location: PACU  Anesthesia Type:General  Level of Consciousness: drowsy  Airway & Oxygen Therapy: Patient Spontanous Breathing and Patient connected to face mask oxygen  Post-op Assessment: Report given to RN  Post vital signs: Reviewed and stable  Last Vitals:  Vitals Value Taken Time  BP 114/72 12/02/22 1215  Temp    Pulse 65 12/02/22 1216  Resp 16 12/02/22 1216  SpO2 100 % 12/02/22 1216  Vitals shown include unfiled device data.  Last Pain:  Vitals:   12/02/22 0625  TempSrc: Temporal  PainSc: 0-No pain         Complications: No notable events documented.

## 2022-12-02 NOTE — H&P (Signed)
No changes to H and P as below, proceed with robotic assisted right hemicolectomy. All r/b/a discussed with patient.   Patient ID: Crystal Erickson, female   DOB: [birth year], 62 y.o.   MRN: [id number] CC: Right Colon Cancer, hepatic flexure  History of Present Illness Crystal Erickson is a 62 y.o. female who presents in evaluation for newly diagnosed intramucosal adenocarcinoma of the hepatic flexure.  The patient had a routine screening colonoscopy recently that showed a 2 cm lesion that was biopsied and proven to be intramucosal adenocarcinoma.  The patient denies any recent bloody bowel movements, weight changes, abdominal pain or changes in her bowel habits.  She says that she is very healthy and works as a Psychologist, educational.  She denies any fevers or chills.  She has a history of colon cancer in her maternal grandfather but no first-degree relatives that have been diagnosed with colon cancer.  SHe has never had surgery before.   Past Medical History     Past Medical History:  Diagnosis Date   Medical history non-contributory                   Past Surgical History:  Procedure Laterality Date   COLONOSCOPY WITH PROPOFOL N/A 11/16/2022    Procedure: COLONOSCOPY WITH PROPOFOL;  Surgeon: Midge Minium, MD;  Location[facility];  Service: Endoscopy;  Laterality: N/A;  SPOT injected at ulcerative mass transverse colon site          Allergies  No Known Allergies           Current Outpatient Medications  Medication Sig Dispense Refill   ALPRAZolam (XANAX) 0.5 MG tablet Take 1 tablet (0.5 mg total) by mouth 2 (two) times daily as needed for anxiety or sleep. 30 tablet 2   Ascorbic Acid (VITAMIN C) 1000 MG tablet Take 1,000 mg by mouth daily.       b complex vitamins tablet Take 1 tablet by mouth 2 (two) times daily.         bisacodyl (DULCOLAX) 5 MG EC tablet Take all 4 tablets at 8 am the morning prior to your surgery. 4 tablet 0   cholecalciferol (VITAMIN D) 1000 UNITS tablet Take 1,000  Units by mouth daily.         Glucosamine-Chondroit-Vit C-Mn (GLUCOSAMINE 1500 COMPLEX PO) Take 1 capsule by mouth 2 (two) times daily.       GLUTAMINE PO Take 5 g by mouth daily.       metroNIDAZOLE (FLAGYL) 500 MG tablet Take 2 tablets at 8AM, take 2 tablets at [facility], and take 2 tablets at 8PM the day prior to your surgery 6 tablet 0   Multiple Vitamins-Minerals (MULTIVITAMIN PO) Take 1 tablet by mouth daily.       neomycin (MYCIFRADIN) 500 MG tablet Take 2 tablet at 8am, take 2 tablets at 2pm, and take 2 tablets at 8pm the day prior to your surgery 6 tablet 0   Omega-3 Fatty Acids (FISH OIL MAXIMUM STRENGTH) 1200 MG CAPS Take 1,200 mg by mouth 2 (two) times daily.       polyethylene glycol powder (MIRALAX) 17 GM/SCOOP powder Mix full container in 64 ounces of Gatorade or other clear liquid. NO Red 238 g 0   estradiol (ESTRACE) 0.1 MG/GM vaginal cream Place 1 g vaginally at bedtime. FOR 14 DAYS, then twice weekly thereafter (Patient not taking: Reported on 11/19/2022) 42.5 g 12      No current facility-administered medications for this visit.  Family History      Family History  Problem Relation Age of Onset   Mental illness Mother 110        alzheimer's   Cancer Mother          metastatic unknown primary   Diabetes Father     Breast cancer Maternal Aunt              Social History Social History  Social History         Tobacco Use   Smoking status: Never      Passive exposure: Never   Smokeless tobacco: Never  Vaping Use   Vaping status: Never Used  Substance Use Topics   Alcohol use: Yes      Alcohol/week: 0.0 standard drinks of alcohol   Drug use: No            ROS Full ROS of systems performed and is otherwise negative there than what is stated in the HPI   Physical Exam Blood pressure 106/68, pulse 87, temperature 98 F (36.7 C), height 5\' 4"  (1.626 m), weight 125 lb (56.7 kg), last menstrual period 08/13/2010, SpO2 99%.   Well-appearing woman, in no  acute distress, normal work of breathing on room air, alert and oriented x 3. Abdomen is soft, nondistended and nontender.  Appropriately nervous about recent cancer diagnosis   Pathology reviewed and consistent with intramucosal adenocarcinoma     Data Reviewed I reviewed her colonoscopy images.  There is a lesion at the hepatic flexure that is approximately 2 cm large.  The colonoscopy report indicates that they did place a tattoo at this site.  Recent labs are notable for a normal BMP and her CBC is largely normal but she does have slightly low platelets at 128.   I have personally reviewed the patient's imaging and medical records.     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  She has been asymptomatic.   Plan     Plan I discussed with the patient about the treatment of colon cancer.  For step will be for her to complete staging workup with a CT chest abdomen pelvis.  I will also have her get a CEA drawn.  If there is no evidence of metastatic disease and the recommendation is for her to proceed with surgical resection of her colon.  I discussed that we would perform a right colectomy with an ileocolonic anastamosis  I discussed with her that her need for any postoperative therapy is contingent on pathology specifically invasion into the nodal basin and that we would have to wait till surgery to determine this.  I did instruct her that she will need to do a bowel prep prior to the procedure and that we will follow eras protocol.  I extensively discussed the risk, benefits and alternatives of the procedure including risk of infection, bleeding, damage to the small bowel or surrounding structures to include the ureter.  I also discussed that there is a risk of anastomotic leak.  I also discussed that there is a risk for need for an ostomy though it is low with right hemicolectomy's.   We will go ahead and schedule her for surgery pending screening workup.  I encouraged the patient to continue working out in the lead up to surgery.  We have instructed her on the medications needed for bowel prep.

## 2022-12-02 NOTE — Plan of Care (Signed)
  Problem: Clinical Measurements: Goal: Respiratory complications will improve Outcome: Progressing   Problem: Activity: Goal: Risk for activity intolerance will decrease Outcome: Progressing   

## 2022-12-02 NOTE — Anesthesia Postprocedure Evaluation (Signed)
Anesthesia Post Note  Patient: Jnae A Parmar  Procedure(s) Performed: XI ROBOT ASSISTED RIGHT Hemicolectomy, RNFA to assist (Abdomen) INDOCYANINE GREEN FLUORESCENCE IMAGING (ICG)  Patient location during evaluation: PACU Anesthesia Type: General Level of consciousness: awake and alert Pain management: pain level controlled Vital Signs Assessment: post-procedure vital signs reviewed and stable Respiratory status: spontaneous breathing, nonlabored ventilation, respiratory function stable and patient connected to nasal cannula oxygen Cardiovascular status: blood pressure returned to baseline and stable Postop Assessment: no apparent nausea or vomiting Anesthetic complications: no   No notable events documented.   Last Vitals:  Vitals:   12/02/22 1245 12/02/22 1300  BP: 120/78 132/87  Pulse: 69 71  Resp: 14 12  Temp:    SpO2: 100% 100%    Last Pain:  Vitals:   12/02/22 1300  TempSrc:   PainSc: 0-No pain                 Corinda Gubler

## 2022-12-02 NOTE — Brief Op Note (Signed)
12/02/2022  12:23 PM  PATIENT:  Crystal Erickson  62 y.o. female  PRE-OPERATIVE DIAGNOSIS:  adenocarcinoma colon  POST-OPERATIVE DIAGNOSIS:  adenocarcinoma colon  PROCEDURE:  Procedure(s): XI ROBOT ASSISTED RIGHT Hemicolectomy, RNFA to assist (N/A) INDOCYANINE GREEN FLUORESCENCE IMAGING (ICG) (N/A)  SURGEON:  Surgeons and Role:    * Kandis Cocking, MD - Primary  PHYSICIAN ASSISTANT:   ASSISTANTS: Patience   ANESTHESIA:   general  EBL:  30ccs   BLOOD ADMINISTERED:none  DRAINS: none   LOCAL MEDICATIONS USED:  MARCAINE    and BUPIVICAINE   SPECIMEN:  Source of Specimen:  terminal ileum, appendix, right colon  DISPOSITION OF SPECIMEN:  PATHOLOGY  COUNTS:  YES  TOURNIQUET:  * No tourniquets in log *  DICTATION: .Dragon Dictation  PLAN OF CARE: Admit to inpatient   PATIENT DISPOSITION:  PACU - hemodynamically stable.   Delay start of Pharmacological VTE agent (>24hrs) due to surgical blood loss or risk of bleeding: no

## 2022-12-02 NOTE — Anesthesia Procedure Notes (Signed)
 Procedure Name: Intubation Date/Time: 12/02/2022 7:36 AM  Performed by: Rosaria Ferries, MDPre-anesthesia Checklist: Patient identified, Emergency Drugs available, Suction available and Patient being monitored Patient Re-evaluated:Patient Re-evaluated prior to induction Oxygen Delivery Method: Circle system utilized Preoxygenation: Pre-oxygenation with 100% oxygen Induction Type: IV induction Ventilation: Mask ventilation without difficulty Laryngoscope Size: Mac and 3 Grade View: Grade I Tube type: Oral Number of attempts: 1 Airway Equipment and Method: Stylet and Oral airway Placement Confirmation: ETT inserted through vocal cords under direct vision, positive ETCO2 and breath sounds checked- equal and bilateral Secured at: 20 cm Tube secured with: Tape Dental Injury: Teeth and Oropharynx as per pre-operative assessment

## 2022-12-02 NOTE — Anesthesia Preprocedure Evaluation (Signed)
Anesthesia Evaluation  Patient identified by MRN, date of birth, ID band Patient awake    Reviewed: Allergy & Precautions, NPO status , Patient's Chart, lab work & pertinent test results  History of Anesthesia Complications Negative for: history of anesthetic complications  Airway Mallampati: III  TM Distance: <3 FB Neck ROM: full    Dental  (+) Chipped   Pulmonary neg pulmonary ROS, neg shortness of breath   Pulmonary exam normal        Cardiovascular Exercise Tolerance: Good (-) angina (-) Past MI and (-) DOE negative cardio ROS Normal cardiovascular exam     Neuro/Psych negative neurological ROS  negative psych ROS   GI/Hepatic negative GI ROS, Neg liver ROS,neg GERD  ,,  Endo/Other  negative endocrine ROS    Renal/GU      Musculoskeletal   Abdominal   Peds  Hematology negative hematology ROS (+)   Anesthesia Other Findings Past Medical History: No date: Colon cancer [facility])  Past Surgical History: 11/16/2022: COLONOSCOPY WITH PROPOFOL; N/A     Comment:  Procedure: COLONOSCOPY WITH PROPOFOL;  Surgeon: Midge Minium, MD;  Location: [facility];  Service:               Endoscopy;  Laterality: N/A;  SPOT injected at ulcerative              mass transverse colon site  BMI    Body Mass Index: 20.60 kg/m      Reproductive/Obstetrics negative OB ROS                             Anesthesia Physical Anesthesia Plan  ASA: 1  Anesthesia Plan: General ETT   Post-op Pain Management:    Induction: Intravenous  PONV Risk Score and Plan: Ondansetron, Dexamethasone, Midazolam and Treatment may vary due to age or medical condition  Airway Management Planned: Oral ETT  Additional Equipment:   Intra-op Plan:   Post-operative Plan: Extubation in OR  Informed Consent: I have reviewed the patients History and Physical, chart, labs and discussed the procedure  including the risks, benefits and alternatives for the proposed anesthesia with the patient or authorized representative who has indicated his/her understanding and acceptance.     Dental Advisory Given  Plan Discussed with: Anesthesiologist, CRNA and Surgeon  Anesthesia Plan Comments: (Patient consented for risks of anesthesia including but not limited to:  - adverse reactions to medications - damage to eyes, teeth, lips or other oral mucosa - nerve damage due to positioning  - sore throat or hoarseness - Damage to heart, brain, nerves, lungs, other parts of body or loss of life  Patient voiced understanding and assent.)       Anesthesia Quick Evaluation

## 2022-12-03 LAB — BASIC METABOLIC PANEL
Anion gap: 8 (ref 5–15)
BUN: 12 mg/dL (ref 8–23)
CO2: 25 mmol/L (ref 22–32)
Calcium: 8.5 mg/dL — ABNORMAL LOW (ref 8.9–10.3)
Chloride: 104 mmol/L (ref 98–111)
Creatinine, Ser: 0.8 mg/dL (ref 0.44–1.00)
GFR, Estimated: >60 mL/min (ref >60)
Glucose, Bld: 131 mg/dL — ABNORMAL HIGH (ref 70–99)
Potassium: 3.9 mmol/L (ref 3.5–5.1)
Sodium: 137 mmol/L (ref 135–145)

## 2022-12-03 LAB — CBC
HCT: 34.3 % — ABNORMAL LOW (ref 36.0–46.0)
Hemoglobin: 12.4 g/dL (ref 12.0–15.0)
MCH: 31.4 pg (ref 26.0–34.0)
MCHC: 36.2 g/dL — ABNORMAL HIGH (ref 30.0–36.0)
MCV: 86.8 fL (ref 80.0–100.0)
Platelets: 106 10*3/uL — ABNORMAL LOW (ref 150–400)
RBC: 3.95 MIL/uL (ref 3.87–5.11)
RDW: 12.6 % (ref 11.5–15.5)
WBC: 8.6 10*3/uL (ref 4.0–10.5)
nRBC: 0 % (ref 0.0–0.2)

## 2022-12-03 MED ORDER — MELATONIN 5 MG PO TABS: 5.0000 mg | ORAL_TABLET | Freq: Every evening | ORAL | Status: DC | PRN

## 2022-12-03 MED ORDER — CHLORHEXIDINE GLUCONATE CLOTH 2 % EX PADS
6.0000 | MEDICATED_PAD | Freq: Every day | CUTANEOUS | Status: DC
  Administered 2022-12-03: 6 via TOPICAL

## 2022-12-03 NOTE — Progress Notes (Signed)
[facility] SURGICAL PROGRESS NOTE  Hospital Day(s): 1.   Post op day(s): 1 Day Post-Op.   Interval History:  Patient seen and examined No acute events or new complaints overnight.  Patient reports just mild discomfort No significant pain No fever, chills, nausea, emesis  Without leukocytosis; 8.6K Hgb to 12.4 Renal function normal; sCr - 0.80; UO - 2120 cs No electrolyte derangements CLD; tolerating She did pass stool last night but nothing this AM  Vital signs in last 24 hours: [min-max] current  Temp:  [96.5 F (35.8 C)-99.6 F (37.6 C)] 99.6 F (37.6 C) (11/21 0456) Pulse Rate:  [62-77] 77 (11/21 0456) Resp:  [12-19] 18 (11/21 0456) BP: (117-132)/(67-96) 132/84 (11/21 0456) SpO2:  [94 %-100 %] 97 % (11/21 0456) Weight:  [59.5 kg] 59.5 kg (11/21 0453)     Height: 5\' 4"  (162.6 cm) Weight: 59.5 kg BMI (Calculated): 22.5   Intake/Output last 2 shifts:  11/20 0701 - 11/21 0700 In: 1900 [I.V.:1300; IV Piggyback:600] Out: 2570 [Urine:2120; Stool:400; Blood:50]   Physical Exam:  Constitutional: alert, cooperative and no distress  Respiratory: breathing non-labored at rest  Cardiovascular: regular rate and sinus rhythm  Gastrointestinal: soft, incisional soreness, and non-distended, no rebound/guarding Genitourinary: Foley in place (removed) Integumentary: Laparoscopic incisions are CDI with dermabond, no erythema   Labs:     Latest Ref Rng & Units 12/03/2022    5:04 AM 11/20/2022    2:29 PM 10/13/2022   10:53 AM  CBC  WBC 4.0 - 10.5 K/uL 8.6  4.8  4.9   Hemoglobin 12.0 - 15.0 g/dL 16.1  09.6  04.5   Hematocrit 36.0 - 46.0 % 34.3  40.2  41.1   Platelets 150 - 400 K/uL 106  123  128.0       Latest Ref Rng & Units 12/03/2022    5:04 AM 11/20/2022    2:30 PM 10/13/2022   10:53 AM  CMP  Glucose 70 - 99 mg/dL 409  94  91   BUN 8 - 23 mg/dL 12  39  31   Creatinine 0.44 - 1.00 mg/dL 8.11  9.14  7.82   Sodium 135 - 145 mmol/L 137  136  140   Potassium  3.5 - 5.1 mmol/L 3.9  4.0  4.0   Chloride 98 - 111 mmol/L 104  103  105   CO2 22 - 32 mmol/L 25  28  29    Calcium 8.9 - 10.3 mg/dL 8.5  8.9  9.0   Total Protein 6.5 - 8.1 g/dL  6.3  6.0   Total Bilirubin <1.2 mg/dL  0.5  0.6   Alkaline Phos 38 - 126 U/L  104  111   AST 15 - 41 U/L  45  37   ALT 0 - 44 U/L  42  36      Imaging studies: No new pertinent imaging studies   Assessment/Plan:  62 y.o. female 1 Day Post-Op s/p robotic assisted laparoscopic right hemicolectomy for adenocarcinoma of the hepatic flexure   - Will keep CLD this morning; If she continues to have bowel function today, we can do FLD later   - Discontinue foley catheter - Completed perioperative Abx - Continue Entereg for now; if she continues to have bowel function today we can stop this  - Monitor abdominal examination; on-going bowel function   - Pain control prn; antiemetics prn - Mobilize as tolerated  All of the above findings and recommendations were discussed with the patient, and  the medical team, and all of patient's questions were answered to her expressed satisfaction.  -- [name], PA-C [facility] 12/03/2022, 7:05 AM M-F: 7am - 4pm

## 2022-12-03 NOTE — TOC CM/SW Note (Signed)
Transition of Care [facility]) - Inpatient Brief Assessment   Patient Details  Name: Crystal Erickson MRN: [id number] Date of Birth: 05-23-1960  Transition of Care [facility]) CM/SW Contact:    Chapman Fitch, RN Phone Number: 12/03/2022, 9:51 AM   Clinical Narrative:   Transition of Care (TOC) Screening Note   Patient Details  Name: Crystal Erickson Date of Birth: 1960-03-10   Transition of Care [facility]) CM/SW Contact:    Chapman Fitch, RN Phone Number: 12/03/2022, 9:51 AM    Transition of Care Department [facility]) has reviewed patient and no TOC needs have been identified at this time. If new patient transition needs arise, please place a TOC consult.    Transition of Care Asessment: Insurance and Status: Insurance coverage has been reviewed Patient has primary care physician: Yes     Prior/Current Home Services: No current home services Social Determinants of Health Reivew: SDOH reviewed no interventions necessary Readmission risk has been reviewed: Yes Transition of care needs: no transition of care needs at this time

## 2022-12-04 LAB — BASIC METABOLIC PANEL
Anion gap: 7 (ref 5–15)
BUN: 7 mg/dL — ABNORMAL LOW (ref 8–23)
CO2: 27 mmol/L (ref 22–32)
Calcium: 8.6 mg/dL — ABNORMAL LOW (ref 8.9–10.3)
Chloride: 103 mmol/L (ref 98–111)
Creatinine, Ser: 0.56 mg/dL (ref 0.44–1.00)
GFR, Estimated: >60 mL/min (ref >60)
Glucose, Bld: 92 mg/dL (ref 70–99)
Potassium: 3.9 mmol/L (ref 3.5–5.1)
Sodium: 137 mmol/L (ref 135–145)

## 2022-12-04 LAB — CBC
HCT: 34.1 % — ABNORMAL LOW (ref 36.0–46.0)
Hemoglobin: 12 g/dL (ref 12.0–15.0)
MCH: 31.1 pg (ref 26.0–34.0)
MCHC: 35.2 g/dL (ref 30.0–36.0)
MCV: 88.3 fL (ref 80.0–100.0)
Platelets: 104 10*3/uL — ABNORMAL LOW (ref 150–400)
RBC: 3.86 MIL/uL — ABNORMAL LOW (ref 3.87–5.11)
RDW: 13.1 % (ref 11.5–15.5)
WBC: 5.8 10*3/uL (ref 4.0–10.5)
nRBC: 0 % (ref 0.0–0.2)

## 2022-12-04 MED ORDER — OXYCODONE HCL 5 MG PO TABS: 5.0000 mg | ORAL_TABLET | ORAL | 0 refills | Status: DC | PRN

## 2022-12-04 MED ORDER — ENOXAPARIN SODIUM 40 MG/0.4ML IJ SOSY: 40.0000 mg | PREFILLED_SYRINGE | INTRAMUSCULAR | 0 refills | Status: DC

## 2022-12-04 MED ORDER — METHOCARBAMOL 500 MG PO TABS: 500.0000 mg | ORAL_TABLET | Freq: Three times a day (TID) | ORAL | 0 refills | Status: DC | PRN

## 2022-12-04 NOTE — Discharge Instructions (Addendum)
In addition to included general post-operative instructions,  Diet: Resume home diet.   Activity: No heavy lifting >20 pounds (children, pets, laundry, garbage) or strenuous activity for 6 weeks, but light activity and walking are encouraged. Do not drive or drink alcohol if taking narcotic pain medications or having pain that might distract from driving.  Wound care: You may shower/get incision wet with soapy water and pat dry (do not rub incisions), but no baths or submerging incision underwater until follow-up.   Medications: Resume all home medications. For mild to moderate pain: acetaminophen (Tylenol) or ibuprofen/naproxen (if no kidney disease). Combining Tylenol with alcohol can substantially increase your risk of causing liver disease. Narcotic pain medications, if prescribed, can be used for severe pain, though may cause nausea, constipation, and drowsiness. Do not combine Tylenol and Percocet (or similar) within a 6 hour period as Percocet (and similar) contain(s) Tylenol. If you do not need the narcotic pain medication, you do not need to fill the prescription.  Call office [phone] / [phone]) at any time if any questions, worsening pain, fevers/chills, bleeding, drainage from incision site, or other concerns.  Agency Name: [facility] Agency Address: [address] [place], [place] [address] Phone: [phone] Website: www.alamances[employer].org Service(s) Offered: Housing services, self-sufficiency, congregate meal program, and individual development account program.  Agency Name: [employer] Address: [address] [place], [place] [address] Phone: [phone] Email: info@[employer] .org Website: www.[employer].org Service(s) Offered: Housing the homeless, feeding the hungry, Company secretary, job and education related services.  Agency Name: [facility] Address: [address]  [place], [place] [address] Phone: [phone] Email: [email or web address]@[employer] .org Service(s) Offered: Counseling, problem pregnancy, advocacy for Hispanics, limited emergency financial assistance.  Agency Name: Department [employer] Social [employer] Address: [address] [place], [place] [address] Phone: [phone] Website: [email or web address]/[email or web address] Service(s) Offered: Child support services; child welfare services; SNAP; Medicaid; work first family assistance; and aid with fuel,  rent, food and medicine.  Agency Name: [employer] Address: [address], [place], [place] [address] Phone: [phone] or [phone] Email: [email or web address] Service(s) Offered: Family services and transient assistance; emergency food, fuel, clothing, limited furniture, utilities; budget counseling, general counseling; give a kid a coat; thrift store; Christmas food and toys. Utility assistance, food pantry, rental  assistance, life sustaining medicine

## 2022-12-04 NOTE — Plan of Care (Signed)
  Problem: Education: Goal: Knowledge of General Education information will improve Description Including pain rating scale, medication(s)/side effects and non-pharmacologic comfort measures Outcome: Progressing   

## 2022-12-04 NOTE — Plan of Care (Signed)
  Problem: Education: Goal: Knowledge of General Education information will improve Description: Including pain rating scale, medication(s)/side effects and non-pharmacologic comfort measures 12/04/2022 1105 by Risa Grill, RN Outcome: Adequate for Discharge 12/04/2022 1056 by Risa Grill, RN Outcome: Progressing   Problem: Health Behavior/Discharge Planning: Goal: Ability to manage health-related needs will improve 12/04/2022 1105 by Ragland-Colvin, Lendon Ka, RN Outcome: Adequate for Discharge 12/04/2022 1056 by Risa Grill, RN Outcome: Progressing   Problem: Clinical Measurements: Goal: Ability to maintain clinical measurements within normal limits will improve 12/04/2022 1105 by Ragland-Colvin, Lendon Ka, RN Outcome: Adequate for Discharge 12/04/2022 1056 by Risa Grill, RN Outcome: Progressing Goal: Will remain free from infection 12/04/2022 1105 by Risa Grill, RN Outcome: Adequate for Discharge 12/04/2022 1056 by Risa Grill, RN Outcome: Progressing Goal: Diagnostic test results will improve 12/04/2022 1105 by Risa Grill, RN Outcome: Adequate for Discharge 12/04/2022 1056 by Risa Grill, RN Outcome: Progressing Goal: Respiratory complications will improve 12/04/2022 1105 by Risa Grill, RN Outcome: Adequate for Discharge 12/04/2022 1056 by Risa Grill, RN Outcome: Progressing Goal: Cardiovascular complication will be avoided 12/04/2022 1105 by Risa Grill, RN Outcome: Adequate for Discharge 12/04/2022 1056 by Risa Grill, RN Outcome: Progressing   Problem: Activity: Goal: Risk for activity intolerance will decrease 12/04/2022 1105 by Ragland-Colvin, Lendon Ka, RN Outcome: Adequate for Discharge 12/04/2022 1056 by Risa Grill, RN Outcome: Progressing   Problem: Nutrition: Goal:  Adequate nutrition will be maintained 12/04/2022 1105 by Risa Grill, RN Outcome: Adequate for Discharge 12/04/2022 1056 by Risa Grill, RN Outcome: Progressing   Problem: Coping: Goal: Level of anxiety will decrease 12/04/2022 1105 by Risa Grill, RN Outcome: Adequate for Discharge 12/04/2022 1056 by Risa Grill, RN Outcome: Progressing   Problem: Elimination: Goal: Will not experience complications related to bowel motility 12/04/2022 1105 by Risa Grill, RN Outcome: Adequate for Discharge 12/04/2022 1056 by Risa Grill, RN Outcome: Progressing Goal: Will not experience complications related to urinary retention 12/04/2022 1105 by Risa Grill, RN Outcome: Adequate for Discharge 12/04/2022 1056 by Risa Grill, RN Outcome: Progressing   Problem: Pain Management: Goal: General experience of comfort will improve 12/04/2022 1105 by Risa Grill, RN Outcome: Adequate for Discharge 12/04/2022 1056 by Risa Grill, RN Outcome: Progressing   Problem: Safety: Goal: Ability to remain free from injury will improve 12/04/2022 1105 by Ragland-Colvin, Lendon Ka, RN Outcome: Adequate for Discharge 12/04/2022 1056 by Risa Grill, RN Outcome: Progressing   Problem: Skin Integrity: Goal: Risk for impaired skin integrity will decrease 12/04/2022 1105 by Risa Grill, RN Outcome: Adequate for Discharge 12/04/2022 1056 by Risa Grill, RN Outcome: Progressing

## 2022-12-04 NOTE — Discharge Summary (Signed)
[facility] SURGICAL DISCHARGE SUMMARY  Patient ID: Crystal Erickson MRN: [id number] DOB/AGE: Feb 26, 1960 62 y.o.  Admit date: 12/02/2022 Discharge date: 12/04/2022  Discharge Diagnoses Patient Active Problem List   Diagnosis Date Noted   Colon cancer [facility]) 12/02/2022   Adenocarcinoma of colon (HCC) 11/20/2022    Consultants None  Procedures 12/02/2022 Robotic assisted laparoscopic right colectomy   HPI: Crystal Erickson is a 62 y.o. female with history of intramucosal adenocarcinoma of the hepatic flexure who presents to [facility] on 11/20 for scheduled resection.   Hospital Course: Informed consent was obtained and documented, and patient underwent uneventful robotic assisted laparoscopic right hemicolectomy (Dr Maurine Minister, 12/02/2022).  Post-operatively, patient did very well with ROBF. Advancement of patient's diet and ambulation were well-tolerated. The remainder of patient's hospital course was essentially unremarkable, and discharge planning was initiated accordingly with patient safely able to be discharged home with appropriate discharge instructions, pain control, and outpatient follow-up after all of her questions were answered to her expressed satisfaction.   Discharge Condition: Good   Physical Examination:  Constitutional: alert, cooperative and no distress  Respiratory: breathing non-labored at rest  Cardiovascular: regular rate and sinus rhythm  Gastrointestinal: soft, incisional soreness, and non-distended, no rebound/guarding Integumentary: Laparoscopic incisions are CDI with dermabond, no erythema    Allergies as of 12/04/2022   No Known Allergies      Medication List     STOP taking these medications    bisacodyl 5 MG EC tablet Commonly known as: DULCOLAX       TAKE these medications    ALPRAZolam 0.5 MG tablet Commonly known as: XANAX Take 1 tablet (0.5 mg total) by mouth 2 (two) times daily as needed for anxiety or sleep.   b  complex vitamins tablet Take 1 tablet by mouth 2 (two) times daily.   cholecalciferol 1000 units tablet Commonly known as: VITAMIN D Take 1,000 Units by mouth daily.   enoxaparin 40 MG/0.4ML injection Commonly known as: LOVENOX Inject 0.4 mLs (40 mg total) into the skin daily for 5 days. Start taking on: December 05, 2022   estradiol 0.1 MG/GM vaginal cream Commonly known as: ESTRACE Place 1 g vaginally at bedtime. FOR 14 DAYS, then twice weekly thereafter   Fish Oil Maximum Strength 1200 MG Caps Take 1,200 mg by mouth 2 (two) times daily.   GLUCOSAMINE 1500 COMPLEX PO Take 1 capsule by mouth 2 (two) times daily.   GLUTAMINE PO Take 5 g by mouth daily.   methocarbamol 500 MG tablet Commonly known as: ROBAXIN Take 1 tablet (500 mg total) by mouth every 8 (eight) hours as needed for muscle spasms.   MULTIVITAMIN PO Take 1 tablet by mouth daily.   oxyCODONE 5 MG immediate release tablet Commonly known as: Oxy IR/ROXICODONE Take 1 tablet (5 mg total) by mouth every 4 (four) hours as needed for moderate pain (pain score 4-6).   vitamin C 1000 MG tablet Take 1,000 mg by mouth daily.          Follow-up Information     Kandis Cocking, MD. Go on 12/17/2022.   Specialty: General Surgery Why: Go to appointment on 12/05 at 845 AM Contact information: [address] [place] [address] [phone]                  Time spent on discharge management including discussion of hospital course, clinical condition, outpatient instructions, prescriptions, and follow up with the patient and members of the medical team: >30  minutes  -- [name] , PA-C [facility]  12/04/2022, 9:58 AM [phone] M-F: 7am - 4pm

## 2022-12-04 NOTE — TOC Transition Note (Signed)
Transition of Care [facility]) - CM/SW Discharge Note   Patient Details  Name: Crystal Erickson MRN: [id number] Date of Birth: 10-Sep-1960  Transition of Care [facility]) CM/SW Contact:  Chapman Fitch, RN Phone Number: 12/04/2022, 11:33 AM   Clinical Narrative:     Utility resources added to AVS        Patient Goals and CMS Choice      Discharge Placement                         Discharge Plan and Services Additional resources added to the After Visit Summary for                                       Social Determinants of Health (SDOH) Interventions SDOH Screenings   Food Insecurity: No Food Insecurity (12/02/2022)  Housing: Low Risk  (12/02/2022)  Transportation Needs: No Transportation Needs (12/02/2022)  Utilities: At Risk (12/02/2022)  Depression (PHQ2-9): Low Risk  (11/20/2022)  Physical Activity: Sufficiently Active (11/20/2022)  Social Connections: Unknown (11/20/2022)  Stress: No Stress Concern Present (11/20/2022)  Tobacco Use: Low Risk  (12/02/2022)  Health Literacy: Adequate Health Literacy (11/20/2022)     Readmission Risk Interventions     No data to display

## 2022-12-07 LAB — SURGICAL PATHOLOGY

## 2022-12-09 ENCOUNTER — Encounter: Payer: Self-pay | Admitting: General Surgery

## 2022-12-09 NOTE — Op Note (Signed)
Procedure Date:  12/02/2022  Pre-operative Diagnosis:  Hepatic Flexure colon cancer  Post-operative Diagnosis:  Same  Procedure:  Robotic assisted Right Colectomy, Evaluation of Graft Perfusion using ICG Dye  Surgeon:  Baker Pierini, M.D.   Assistant:  Patience  Anesthesia:  General endotracheal  Estimated Blood Loss:  20 ml  Specimens:  Right colon with terminal ileum and appenndix   Complications:  None  Indications for Procedure:  This is a 62 y.o. female who presents with hepatic flexure mass with intramucosal adenocarcinoma.  The benefits, complications, treatment options, and expected outcomes were discussed with the patient. The risks of bleeding, infection, bowel injury, and need for further procedures were all discussed with the patient and she was willing to proceed.  Description of Procedure: The patient was correctly identified in the preoperative area and brought into the operating room.  The patient was placed supine with VTE prophylaxis in place.  Appropriate time-outs were performed.  Anesthesia was induced and the patient was intubated.  Foley catheter was placed.  Appropriate antibiotics were infused.  The abdomen was prepped and draped in a sterile fashion.  A pfannenstiel incision was made and taken down through the subcutaneous tissue with bovie cautery. The fascia was identified and incised. The underlying rectus muscles were retracted laterally. The peritoneum was then entered sharply. A medium Alexis wound protector was placed into the peritoneum and pneumoperitoneum was established. A 12mm robotic port was floated through the wound protector. Bilaterally TAPP blocks were then performed with a total of 60ccs of marcaine-liposomal bupivicaine solution. Three additional 8mm robotic trocars were placed along the left hemiabdomen under direct visualization. An additional 8mm airseal port was placed lateral and triangulated with the previous ports.  The patient was then  placed in slight Trendelenburg position and rotated right side up.  The small bowel and omentum were mobilized cephalad and to the patient's left side.  The DaVinci platform was then docked, camera targeted, and instruments placed under direct visualization.    The ileocecal junction was then tented anteriorly, exposing the ileocolic pedicle.  Cautery was used to score the peritoneum and our medial to lateral dissection was started.  The pedicle was isolated and cauterized using the Vessel sealer.  Dissection proceeded both laterally and superiorly, assuring identification of the duodenum and right ureter.  Eventually dissection plane reached the hepatic flexure and the hepatocolic ligament was traversed, allowing visualization of the liver itself.  At that point, I proceeded with dissection of the lateral attachments of the colon along the White line of [place] going from the hepatic flexure to the ileocecal junction.  Then the mesentery going to the terminal ileum was divided from the already divided ileocolic pedicle to the planned point of transection at the terminal ileum.  After that, I was able to complete the lateral mobilization of the colon and terminal ileum.  Then I proceeded to dividing the mesentery of the transverse colon going to our planned point of distal transection, assuring the duodenum was maintained within the retroperitoneal plane, and diving the right colic pedicle and right branch of the middle colic pedicle using Vessel sealer.  The omentum was cleared off the proximal transverse colon and this completed all the mobilization.  2.5 mg of ICG followed by saline flush was injected and FireFly was used to visualize the blood flow towards both proximal and distal transection points.  A 60 mm blue load stapler was fired at both transection points without difficulty.  The specimen was then moved  above the liver.  Then the two ends of the bowel were lined up in anti-peristaltic fashion fashion.  A  2-0 vicryl retention suture was placed and enterotomy and colotomy were created with cautery scissors.  Another 60 mm blue load stapler was fired to create our anastomosis, and a 2-0 Stratafix suture was used to close the common channel in two layers.  Following completion, the right upper quadrant was irrigated/suctioned.  The smal bowel and omentum were placed in anatomical position, with omentum covering the new anastomosis.  The specimen was then removed via the pfannenstiel incision.   At that point, pneumoperitoneum was released and ports were removed.    We then proceeded with our clean closure after changing gowns and gloves.   The peritoneum of the pfannenstiel incision was closed with running 2-0 Vicryl, and the fascia with running 0 PDS.  The dermis was approximated with 3-0 Vicryl, and the skin of all incisions was closed with 4-0 monocroyl. The wounds were then dressed with dermabond.  The patient was emerged from anesthesia and extubated and brought to the recovery room for further management.   The patient tolerated the procedure well and all counts were correct at the end of the case. Prior to closure all sponge and instrument counts were correct x 2.

## 2022-12-17 ENCOUNTER — Encounter: Payer: Self-pay | Admitting: General Surgery

## 2022-12-17 ENCOUNTER — Ambulatory Visit (INDEPENDENT_AMBULATORY_CARE_PROVIDER_SITE_OTHER): Payer: 59 | Admitting: General Surgery

## 2022-12-17 VITALS — BP 113/73 | HR 76 | Temp 98.0°F | Ht 64.0 in | Wt 119.4 lb

## 2022-12-17 DIAGNOSIS — C183 Malignant neoplasm of hepatic flexure: Secondary | ICD-10-CM

## 2022-12-17 DIAGNOSIS — Z08 Encounter for follow-up examination after completed treatment for malignant neoplasm: Secondary | ICD-10-CM

## 2022-12-17 DIAGNOSIS — C189 Malignant neoplasm of colon, unspecified: Secondary | ICD-10-CM

## 2022-12-17 NOTE — Patient Instructions (Addendum)
We will have you follow up here in 6 months. You may start increasing your exercise gradually.  Restrictions will be fully lifted in 2 weeks. You may start back on your regular diet adding things in slowly.   GENERAL POST-OPERATIVE PATIENT INSTRUCTIONS   WOUND CARE INSTRUCTIONS: Try to keep the wound dry and avoid ointments on the wound unless directed to do so.  If the wound becomes bright red and painful or starts to drain infected material that is not clear, please contact your physician immediately.  If the wound is mildly pink and has a thick firm ridge underneath it, this is normal, and is referred to as a healing ridge.  This will resolve over the next 4-6 weeks.  BATHING: You may shower if you have been informed of this by your surgeon. However, Please do not submerge in a tub, hot tub, or pool until incisions are completely sealed or have been told by your surgeon that you may do so.  DIET:  You may eat any foods that you can tolerate.  It is a good idea to eat a high fiber diet and take in plenty of fluids to prevent constipation.  If you do become constipated you may want to take a mild laxative or take ducolax tablets on a daily basis until your bowel habits are regular.  Constipation can be very uncomfortable, along with straining, after recent surgery.  ACTIVITY: You should not lift more than 20 pounds for 6 weeks total after surgery as it could put you at increased risk for complications.  Twenty pounds is roughly equivalent to a plastic bag of groceries. At that time- Listen to your body when lifting, if you have pain when lifting, stop and then try again in a few days. Soreness after doing exercises or activities of daily living is normal as you get back in to your normal routine.  MEDICATIONS:  Try to take narcotic medications and anti-inflammatory medications, such as tylenol, ibuprofen, naprosyn, etc., with food.  This will minimize stomach upset from the medication.  Should you  develop nausea and vomiting from the pain medication, or develop a rash, please discontinue the medication and contact your physician.  You should not drive, make important decisions, or operate machinery when taking narcotic pain medication.  SUNBLOCK Use sun block to incision area over the next year if this area will be exposed to sun. This helps decrease scarring and will allow you avoid a permanent darkened area over your incision.  QUESTIONS:  Please feel free to call our office if you have any questions, and we will be glad to assist you. [phone]

## 2022-12-17 NOTE — Progress Notes (Signed)
The patient returns today status post robotic assisted right colectomy for colon cancer.  She reports doing well.  Her pain is minimal and she is tolerating diet.  She initially had some bloody bowel movements but that has resolved and she has having normal, soft bowel movements.  She denies any nausea or vomiting.  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  She has avoided lifting but is starting to walk more.  On exam her abdomen is soft, tender over the incisions.  Her incisions are all clean and dry with glue on the Pfannenstiel incision as well as the left superior incision.  The other incisions have started to have their glue peel off.  I have reviewed her pathology results with her.  This was consistent with a T2 N0 colon adenocarcinoma.  A total of 22 lymph nodes were harvested.  The patient is status post robotic right hemicolectomy for colon cancer.  She is doing well.  I answered her questions about nutrition, supplementation and working out.  She should refrain from heavy lifting for another 2 weeks.  She can start to incorporate more walking and some running into her fitness routine.  As far as follow-up for her cancer I discussed with her that the recommendations are that she undergo a colonoscopy in 1 year.  I will plan to see her again in 6 months to assess how she is doing.  I discussed warning signs for recurrence including unexplained weight loss, bowel habit changes, blood in her stool or abdominal pain.  At this time I do not think we need to get routine imaging or CEA however she is going to follow-up with her oncologist and I will defer to them if they think that repeating CEA or imaging is appropriate  RTC in 6 months

## 2022-12-18 ENCOUNTER — Ambulatory Visit: Payer: 59 | Admitting: Oncology

## 2022-12-18 ENCOUNTER — Encounter: Payer: Self-pay | Admitting: Oncology

## 2022-12-18 ENCOUNTER — Inpatient Hospital Stay: Payer: 59 | Attending: Oncology | Admitting: Oncology

## 2022-12-18 VITALS — BP 125/88 | HR 77 | Temp 97.1°F | Resp 18 | Wt 124.8 lb

## 2022-12-18 DIAGNOSIS — Z9049 Acquired absence of other specified parts of digestive tract: Secondary | ICD-10-CM | POA: Insufficient documentation

## 2022-12-18 DIAGNOSIS — Z7189 Other specified counseling: Secondary | ICD-10-CM

## 2022-12-18 DIAGNOSIS — C183 Malignant neoplasm of hepatic flexure: Secondary | ICD-10-CM | POA: Diagnosis not present

## 2022-12-18 DIAGNOSIS — Z803 Family history of malignant neoplasm of breast: Secondary | ICD-10-CM | POA: Diagnosis not present

## 2022-12-18 DIAGNOSIS — C189 Malignant neoplasm of colon, unspecified: Secondary | ICD-10-CM

## 2022-12-20 NOTE — Progress Notes (Unsigned)
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 Servando Snare on 11/16/2022.  Colonoscopy showed an ulcerating nonobstructive medium-sized mass at the hepatic flexure which was noncircumferential and measured 20 mm.  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  Although submucosal invasion is not identified in this biopsy the architectural complexity and cytologic atypia in conjunction with the endoscopy impression of the mass suggest that this could be adjacent to invasive component.Patient has been seen by Dr. Baker Pierini and his CT scan has been ordered for 11/24/2022.    Interval history- she is recovering well from her colon surgery. Denies any complaints at this time.  ECOG PS- 0 Pain scale- 0   Review of systems- Review of Systems  Constitutional:  Negative for chills, fever, malaise/fatigue and weight loss.  HENT:  Negative for congestion, ear discharge and nosebleeds.   Eyes:  Negative for blurred vision.  Respiratory:  Negative for cough, hemoptysis, sputum production, shortness of breath and wheezing.   Cardiovascular:  Negative for chest pain, palpitations, orthopnea and claudication.  Gastrointestinal:  Negative for abdominal pain, blood in stool, constipation, diarrhea, heartburn, melena, nausea and vomiting.  Genitourinary:  Negative for dysuria, flank pain, frequency, hematuria and  urgency.  Musculoskeletal:  Negative for back pain, joint pain and myalgias.  Skin:  Negative for rash.  Neurological:  Negative for dizziness, tingling, focal weakness, seizures, weakness and headaches.  Endo/Heme/Allergies:  Does not bruise/bleed easily.  Psychiatric/Behavioral:  Negative for depression and suicidal ideas. The patient does not have insomnia.       No Known Allergies   Past Medical History:  Diagnosis Date   Colon cancer (HCC) 11/16/2022   Stage 1     Past Surgical History:  Procedure Laterality Date   COLON SURGERY  12/02/2022   Right Hemicolectomy   COLONOSCOPY WITH PROPOFOL N/A 11/16/2022   Procedure: COLONOSCOPY WITH PROPOFOL;  Surgeon: Midge Minium, MD;  Location: [facility];  Service: Endoscopy;  Laterality: N/A;  SPOT injected at ulcerative mass transverse colon site    Social History   Socioeconomic History   Marital status: Significant Other    Spouse name: Not on file   Number of children: Not on file   Years of education: Not on file   Highest education level: Not on file  Occupational History   Occupation: Systems analyst    Employer: [employer]  Tobacco Use   Smoking status: Never    Passive exposure: Never   Smokeless tobacco: Never  Vaping Use   Vaping status: Never Used  Substance and Sexual Activity   Alcohol use: Yes    Alcohol/week: 0.0 standard drinks of alcohol   Drug use: No   Sexual activity: Yes  Other Topics Concern   Not on file  Social History Narrative   Not on file   Social Determinants of Health   Financial Resource Strain: Not on file  Food Insecurity: No Food Insecurity (12/02/2022)   Hunger Vital Sign  Worried About Programme researcher, broadcasting/film/video in the Last Year: Never true    Ran Out of Food in the Last Year: Never true  Transportation Needs: No Transportation Needs (12/02/2022)   PRAPARE - Administrator, Civil Service (Medical): No    Lack of Transportation (Non-Medical): No   Physical Activity: Sufficiently Active (11/20/2022)   Exercise Vital Sign    Days of Exercise per Week: 7 days    Minutes of Exercise per Session: 60 min  Stress: No Stress Concern Present (11/20/2022)   [employer] of Occupational Health - Occupational Stress Questionnaire    Feeling of Stress : Not at all  Social Connections: Unknown (11/20/2022)   Social Connection and Isolation Panel [NHANES]    Frequency of Communication with Friends and Family: More than three times a week    Frequency of Social Gatherings with Friends and Family: More than three times a week    Attends Religious Services: Not on file    Active Member of Clubs or Organizations: Not on file    Attends Banker Meetings: Not on file    Marital Status: Married  Intimate Partner Violence: Not At Risk (12/02/2022)   Humiliation, Afraid, Rape, and Kick questionnaire    Fear of Current or Ex-Partner: No    Emotionally Abused: No    Physically Abused: No    Sexually Abused: No    Family History  Problem Relation Age of Onset   Mental illness Mother 81       alzheimer's   Cancer Mother        metastatic unknown primary   Diabetes Father    Breast cancer Maternal Aunt      Current Outpatient Medications:    ALPRAZolam (XANAX) 0.5 MG tablet, Take 1 tablet (0.5 mg total) by mouth 2 (two) times daily as needed for anxiety or sleep., Disp: 30 tablet, Rfl: 2   Ascorbic Acid (VITAMIN C) 1000 MG tablet, Take 1,000 mg by mouth daily., Disp: , Rfl:    b complex vitamins tablet, Take 1 tablet by mouth 2 (two) times daily.  , Disp: , Rfl:    cholecalciferol (VITAMIN D) 1000 UNITS tablet, Take 1,000 Units by mouth daily.  , Disp: , Rfl:    Glucosamine-Chondroit-Vit C-Mn (GLUCOSAMINE 1500 COMPLEX PO), Take 1 capsule by mouth 2 (two) times daily., Disp: , Rfl:    GLUTAMINE PO, Take 5 g by mouth daily., Disp: , Rfl:    Multiple Vitamins-Minerals (MULTIVITAMIN PO), Take 1 tablet by mouth daily., Disp: , Rfl:     Omega-3 Fatty Acids (FISH OIL MAXIMUM STRENGTH) 1200 MG CAPS, Take 1,200 mg by mouth 2 (two) times daily., Disp: , Rfl:   Physical exam:  Vitals:   12/18/22 1458  BP: 125/88  Pulse: 77  Resp: 18  Temp: (!) 97.1 F (36.2 C)  TempSrc: Tympanic  SpO2: 100%  Weight: 124 lb 12.8 oz (56.6 kg)   Physical Exam Cardiovascular:     Rate and Rhythm: Normal rate and regular rhythm.     Heart sounds: Normal heart sounds.  Pulmonary:     Effort: Pulmonary effort is normal.     Breath sounds: Normal breath sounds.  Abdominal:     General: Bowel sounds are normal.     Palpations: Abdomen is soft.     Comments: Laparoscopy scars healing well  Skin:    General: Skin is warm and dry.  Neurological:     Mental Status: She is alert  and oriented to person, place, and time.        Latest Ref Rng & Units 12/04/2022    4:26 AM  CMP  Glucose 70 - 99 mg/dL 92   BUN 8 - 23 mg/dL 7   Creatinine 2.95 - 6.21 mg/dL 3.08   Sodium 657 - 846 mmol/L 137   Potassium 3.5 - 5.1 mmol/L 3.9   Chloride 98 - 111 mmol/L 103   CO2 22 - 32 mmol/L 27   Calcium 8.9 - 10.3 mg/dL 8.6       Latest Ref Rng & Units 12/04/2022    4:26 AM  CBC  WBC 4.0 - 10.5 K/uL 5.8   Hemoglobin 12.0 - 15.0 g/dL 96.2   Hematocrit 95.2 - 46.0 % 34.1   Platelets 150 - 400 K/uL 104     No images are attached to the encounter.  CT Chest W Contrast  Result Date: 11/28/2022 CLINICAL DATA:  Colon cancer, staging.  * Tracking Code: BO * EXAM: CT CHEST, ABDOMEN, AND PELVIS WITH CONTRAST TECHNIQUE: Multidetector CT imaging of the chest, abdomen and pelvis was performed following the standard protocol during bolus administration of intravenous contrast. RADIATION DOSE REDUCTION: This exam was performed according to the departmental dose-optimization program which includes automated exposure control, adjustment of the mA and/or kV according to patient size and/or use of iterative reconstruction technique. CONTRAST:  OMNIPAQUE  IOHEXOL 300 MG/ML  SOLN COMPARISON:  None Available. FINDINGS: CT CHEST FINDINGS Cardiovascular: Normal caliber thoracic aorta. No central pulmonary embolus on this nondedicated study. Coronary artery calcifications. Normal size heart. No significant pericardial effusion/thickening. Mediastinum/Nodes: No suspicious thyroid nodule. No pathologically enlarged mediastinal, hilar or axillary lymph nodes. The esophagus is grossly unremarkable. Lungs/Pleura: No suspicious pulmonary nodules or masses. Scattered scarring versus atelectasis. No pleural effusion. No pneumothorax. Musculoskeletal: No aggressive lytic or blastic lesion of bone. Multilevel degenerative change of the spine. CT ABDOMEN PELVIS FINDINGS Hepatobiliary: Bilobar fluid density hepatic lesions measure up to 4.5 cm in the left lobe of the liver on image 54/1. Additional bilobar hypodense hepatic lesions technically too small to accurately characterize. Gallbladder is unremarkable. No biliary ductal dilation. Pancreas: No pancreatic ductal dilation or evidence of acute inflammation. Spleen: No splenomegaly. Adrenals/Urinary Tract: Bilateral adrenal glands appear normal. No hydronephrosis. Kidneys demonstrate symmetric enhancement. Urinary bladder is unremarkable for degree of distension. Stomach/Bowel: Radiopaque enteric contrast material traverses distal loops of small bowel. Stomach is unremarkable for degree of distension. No pathologic dilation of small or large bowel. Moderate volume of formed stool in the colon. Patient's reported colonic neoplasm is not confidently identified on this examination. Vascular/Lymphatic: Normal caliber abdominal aorta. Smooth IVC contours. The portal, splenic and superior mesenteric veins are patent. No pathologically enlarged abdominal or pelvic lymph nodes. Reproductive: Uterus and bilateral adnexa are unremarkable. Other: No significant abdominopelvic free fluid. No discrete peritoneal or omental nodularity.  Musculoskeletal: No aggressive lytic or blastic lesion of bone. Multilevel degenerative changes spine. IMPRESSION: 1. Patient's reported colonic neoplasm is not confidently identified on this examination. 2. No convincing evidence of metastatic disease in the chest, abdomen or pelvis. 3. Bilobar fluid density hepatic lesions measure up to 4.5 cm in the left lobe of the liver, compatible with cysts. Additional bilobar hypodense hepatic lesions technically too small to accurately characterize, but favored benign. Consider more definitive assessment of the abdominal abdominal MRI with and without contrast versus attention on follow-up imaging. 4. Moderate volume of formed stool in the colon. Electronically Signed   By:  Maudry Mayhew M.D.   On: 11/28/2022 08:37   CT ABDOMEN PELVIS W CONTRAST  Result Date: 11/28/2022 CLINICAL DATA:  Colon cancer, staging.  * Tracking Code: BO * EXAM: CT CHEST, ABDOMEN, AND PELVIS WITH CONTRAST TECHNIQUE: Multidetector CT imaging of the chest, abdomen and pelvis was performed following the standard protocol during bolus administration of intravenous contrast. RADIATION DOSE REDUCTION: This exam was performed according to the departmental dose-optimization program which includes automated exposure control, adjustment of the mA and/or kV according to patient size and/or use of iterative reconstruction technique. CONTRAST:  OMNIPAQUE IOHEXOL 300 MG/ML  SOLN COMPARISON:  None Available. FINDINGS: CT CHEST FINDINGS Cardiovascular: Normal caliber thoracic aorta. No central pulmonary embolus on this nondedicated study. Coronary artery calcifications. Normal size heart. No significant pericardial effusion/thickening. Mediastinum/Nodes: No suspicious thyroid nodule. No pathologically enlarged mediastinal, hilar or axillary lymph nodes. The esophagus is grossly unremarkable. Lungs/Pleura: No suspicious pulmonary nodules or masses. Scattered scarring versus atelectasis. No pleural  effusion. No pneumothorax. Musculoskeletal: No aggressive lytic or blastic lesion of bone. Multilevel degenerative change of the spine. CT ABDOMEN PELVIS FINDINGS Hepatobiliary: Bilobar fluid density hepatic lesions measure up to 4.5 cm in the left lobe of the liver on image 54/1. Additional bilobar hypodense hepatic lesions technically too small to accurately characterize. Gallbladder is unremarkable. No biliary ductal dilation. Pancreas: No pancreatic ductal dilation or evidence of acute inflammation. Spleen: No splenomegaly. Adrenals/Urinary Tract: Bilateral adrenal glands appear normal. No hydronephrosis. Kidneys demonstrate symmetric enhancement. Urinary bladder is unremarkable for degree of distension. Stomach/Bowel: Radiopaque enteric contrast material traverses distal loops of small bowel. Stomach is unremarkable for degree of distension. No pathologic dilation of small or large bowel. Moderate volume of formed stool in the colon. Patient's reported colonic neoplasm is not confidently identified on this examination. Vascular/Lymphatic: Normal caliber abdominal aorta. Smooth IVC contours. The portal, splenic and superior mesenteric veins are patent. No pathologically enlarged abdominal or pelvic lymph nodes. Reproductive: Uterus and bilateral adnexa are unremarkable. Other: No significant abdominopelvic free fluid. No discrete peritoneal or omental nodularity. Musculoskeletal: No aggressive lytic or blastic lesion of bone. Multilevel degenerative changes spine. IMPRESSION: 1. Patient's reported colonic neoplasm is not confidently identified on this examination. 2. No convincing evidence of metastatic disease in the chest, abdomen or pelvis. 3. Bilobar fluid density hepatic lesions measure up to 4.5 cm in the left lobe of the liver, compatible with cysts. Additional bilobar hypodense hepatic lesions technically too small to accurately characterize, but favored benign. Consider more definitive assessment of the  abdominal abdominal MRI with and without contrast versus attention on follow-up imaging. 4. Moderate volume of formed stool in the colon. Electronically Signed   By: Maudry Mayhew M.D.   On: 11/28/2022 08:37     Assessment and plan- Patient is a 62 y.o. female with h/o Stage I colon adenocarcinoma pT2N0M0 s/p hemicolectomy here to discuss further management  Discussed results of final pathology after hemicolectomy which showed 2 cm moderately differentiated adenocarcinoma extending into muscularis propria. 22 nodes negative for malignancy. Negative margins. No LVI or PNI. Margins negative. This would be stgae I colon acncer and she would not benefit from adjuvant chemotherapy. No role for adjuvant RT. She will need surveillance colonoscopy a year after surgery. No role for surveillance imaging. I will see her back in 1 year with labs. I am referring her for genetic counseling.     Cancer Staging  Adenocarcinoma of colon [facility]) Staging form: Colon and Rectum, AJCC 8th Edition - Pathologic  stage from 12/18/2022: Stage I (pT2, pN0, cM0) - Signed by Creig Hines, MD on 12/18/2022 Total positive nodes: 0 Histologic grading system: 4 grade system Histologic grade (G): G2 Residual tumor (R): R0 - None   Visit Diagnosis 1. Adenocarcinoma of colon (HCC)   2. Goals of care, counseling/discussion      Dr. Owens Shark, MD, MPH [facility] [phone] 12/20/2022 6:04 PM

## 2022-12-29 ENCOUNTER — Ambulatory Visit (INDEPENDENT_AMBULATORY_CARE_PROVIDER_SITE_OTHER): Payer: 59 | Admitting: Internal Medicine

## 2022-12-29 ENCOUNTER — Encounter: Payer: Self-pay | Admitting: Internal Medicine

## 2022-12-29 VITALS — BP 100/72 | HR 91 | Ht 64.0 in | Wt 123.4 lb

## 2022-12-29 DIAGNOSIS — C189 Malignant neoplasm of colon, unspecified: Secondary | ICD-10-CM

## 2022-12-29 DIAGNOSIS — D696 Thrombocytopenia, unspecified: Secondary | ICD-10-CM | POA: Diagnosis not present

## 2022-12-29 DIAGNOSIS — D62 Acute posthemorrhagic anemia: Secondary | ICD-10-CM

## 2022-12-29 NOTE — Assessment & Plan Note (Signed)
Persistent , mild,  stable.  Repeat annually.  Workup has historically been deferred   Lab Results  Component Value Date   WBC 5.8 12/04/2022   HGB 12.0 12/04/2022   HCT 34.1 (L) 12/04/2022   MCV 88.3 12/04/2022   PLT 104 (L) 12/04/2022

## 2022-12-29 NOTE — Progress Notes (Signed)
Subjective:  Patient ID: Crystal Erickson, female    DOB: 07-11-1960  Age: 62 y.o. MRN: [id number]  CC: The primary encounter diagnosis was Anemia associated with acute blood loss. Diagnoses of Hypocalcemia, Adenocarcinoma of colon (HCC), and Thrombocytopenia (HCC) were also pertinent to this visit.   HPI [facility] presents for  Chief Complaint  Patient presents with   Medical Management of Chronic Issues   Recently found to have colon CA on routine screening colonoscopy  done Nov 4.  Underwent laparoscopic right hemicolectomy on Nov 20th,  22 /22 LNs were negative .  No plans for adjuvant chemo given early stage of CA .  Repeat colonoscopy is needed  Nov 2025.    Her wounds have heeld.  Her bowel moveents have returned to normal.  She feels good  and has resumed exercise and regular diet .  Her children have been advised to have screening        Outpatient Medications Prior to Visit  Medication Sig Dispense Refill   ALPRAZolam (XANAX) 0.5 MG tablet Take 1 tablet (0.5 mg total) by mouth 2 (two) times daily as needed for anxiety or sleep. 30 tablet 2   Ascorbic Acid (VITAMIN C) 1000 MG tablet Take 1,000 mg by mouth daily.     b complex vitamins tablet Take 1 tablet by mouth 2 (two) times daily.       cholecalciferol (VITAMIN D) 1000 UNITS tablet Take 1,000 Units by mouth daily.       Glucosamine-Chondroit-Vit C-Mn (GLUCOSAMINE 1500 COMPLEX PO) Take 1 capsule by mouth 2 (two) times daily.     GLUTAMINE PO Take 5 g by mouth daily.     Multiple Vitamins-Minerals (MULTIVITAMIN PO) Take 1 tablet by mouth daily.     Omega-3 Fatty Acids (FISH OIL MAXIMUM STRENGTH) 1200 MG CAPS Take 1,200 mg by mouth 2 (two) times daily.     No facility-administered medications prior to visit.    Review of Systems;  Patient denies headache, fevers, malaise, unintentional weight loss, skin rash, eye pain, sinus congestion and sinus pain, sore throat, dysphagia,  hemoptysis , cough, dyspnea, wheezing,  chest pain, palpitations, orthopnea, edema, abdominal pain, nausea, melena, diarrhea, constipation, flank pain, dysuria, hematuria, urinary  Frequency, nocturia, numbness, tingling, seizures,  Focal weakness, Loss of consciousness,  Tremor, insomnia, depression, anxiety, and suicidal ideation.      Objective:  BP 100/72   Pulse 91   Ht 5\' 4"  (1.626 m)   Wt 123 lb 6.4 oz (56 kg)   LMP 08/13/2010   SpO2 99%   BMI 21.18 kg/m   BP Readings from Last 3 Encounters:  12/29/22 100/72  12/18/22 125/88  12/17/22 113/73    Wt Readings from Last 3 Encounters:  12/29/22 123 lb 6.4 oz (56 kg)  12/18/22 124 lb 12.8 oz (56.6 kg)  12/17/22 119 lb 6.4 oz (54.2 kg)    Physical Exam Vitals reviewed.  Constitutional:      General: She is not in acute distress.    Appearance: Normal appearance. She is normal weight. She is not ill-appearing, toxic-appearing or diaphoretic.  HENT:     Head: Normocephalic.  Eyes:     General: No scleral icterus.       Right eye: No discharge.        Left eye: No discharge.     Conjunctiva/sclera: Conjunctivae normal.  Cardiovascular:     Rate and Rhythm: Normal rate and regular rhythm.     Heart sounds:  Normal heart sounds.  Pulmonary:     Effort: Pulmonary effort is normal. No respiratory distress.     Breath sounds: Normal breath sounds.  Abdominal:     General: Abdomen is flat. Bowel sounds are normal.     Palpations: Abdomen is soft.       Comments: Well healed laparascopic scars   Musculoskeletal:        General: Normal range of motion.  Skin:    General: Skin is warm and dry.  Neurological:     General: No focal deficit present.     Mental Status: She is alert and oriented to person, place, and time. Mental status is at baseline.  Psychiatric:        Mood and Affect: Mood normal.        Behavior: Behavior normal.        Thought Content: Thought content normal.        Judgment: Judgment normal.    Lab Results  Component Value Date    HGBA1C 5.7 10/13/2022    Lab Results  Component Value Date   CREATININE 0.56 12/04/2022   CREATININE 0.80 12/03/2022   CREATININE 0.70 11/20/2022    Lab Results  Component Value Date   WBC 5.8 12/04/2022   HGB 12.0 12/04/2022   HCT 34.1 (L) 12/04/2022   PLT 104 (L) 12/04/2022   GLUCOSE 92 12/04/2022   CHOL 208 (H) 10/13/2022   TRIG 91.0 10/13/2022   HDL 102.00 10/13/2022   LDLDIRECT 80.0 05/05/2016   LDLCALC 87 10/13/2022   ALT 42 11/20/2022   AST 45 (H) 11/20/2022   NA 137 12/04/2022   K 3.9 12/04/2022   CL 103 12/04/2022   CREATININE 0.56 12/04/2022   BUN 7 (L) 12/04/2022   CO2 27 12/04/2022   TSH 1.59 10/13/2022   HGBA1C 5.7 10/13/2022    No results found.  Assessment & Plan:  .Anemia associated with acute blood loss -     CBC with Differential/Platelet  Hypocalcemia -     Basic metabolic panel -     Magnesium  Adenocarcinoma of colon (HCC) Assessment & Plan: Stage I.  S/p right hemocolectomy. No LN 's involved.  Follow up colonoscopy 1 year   Thrombocytopenia (HCC) Assessment & Plan: Persistent , mild,  stable.  Repeat annually.  Workup has historically been deferred   Lab Results  Component Value Date   WBC 5.8 12/04/2022   HGB 12.0 12/04/2022   HCT 34.1 (L) 12/04/2022   MCV 88.3 12/04/2022   PLT 104 (L) 12/04/2022       Follow-up: No follow-ups on file.   Sherlene Shams, MD

## 2022-12-29 NOTE — Assessment & Plan Note (Signed)
Stage I.  S/p right hemocolectomy. No LN 's involved.  Follow up colonoscopy 1 year

## 2022-12-29 NOTE — Patient Instructions (Addendum)
Thank God you had your screening colonoscopy!      I'm checking your lytes and hgb today.    You might want to try using Relaxium for insomnia  (as seen on TV commercials) . It is available through [employer] and contains all natural supplements:  Melatonin 5 mg  Chamomile 25 mg Passionflower extract 75 mg GABA 100 mg Ashwaganda extract 125 mg Magnesium citrate, glycinate, oxide (100 mg)  L tryptophan 500 mg Valerest (proprietary  ingredient ; probably valeria root extract)

## 2022-12-30 ENCOUNTER — Telehealth: Payer: Self-pay | Admitting: Oncology

## 2022-12-30 ENCOUNTER — Other Ambulatory Visit: Payer: Self-pay | Admitting: *Deleted

## 2022-12-30 DIAGNOSIS — C189 Malignant neoplasm of colon, unspecified: Secondary | ICD-10-CM

## 2022-12-30 LAB — BASIC METABOLIC PANEL
BUN: 36 mg/dL — ABNORMAL HIGH (ref 6–23)
CO2: 27 meq/L (ref 19–32)
Calcium: 9 mg/dL (ref 8.4–10.5)
Chloride: 104 meq/L (ref 96–112)
Creatinine, Ser: 0.63 mg/dL (ref 0.40–1.20)
GFR: 95 mL/min (ref >60.00)
Glucose, Bld: 84 mg/dL (ref 70–99)
Potassium: 4 meq/L (ref 3.5–5.1)
Sodium: 140 meq/L (ref 135–145)

## 2022-12-30 LAB — CBC WITH DIFFERENTIAL/PLATELET
Basophils Absolute: 0.1 10*3/uL (ref 0.0–0.1)
Basophils Relative: 1.2 % (ref 0.0–3.0)
Eosinophils Absolute: 0.2 10*3/uL (ref 0.0–0.7)
Eosinophils Relative: 3.9 % (ref 0.0–5.0)
HCT: 34.5 % — ABNORMAL LOW (ref 36.0–46.0)
Hemoglobin: 11.6 g/dL — ABNORMAL LOW (ref 12.0–15.0)
Lymphocytes Relative: 42.9 % (ref 12.0–46.0)
Lymphs Abs: 2 10*3/uL (ref 0.7–4.0)
MCHC: 33.7 g/dL (ref 30.0–36.0)
MCV: 92.4 fL (ref 78.0–100.0)
Monocytes Absolute: 0.3 10*3/uL (ref 0.1–1.0)
Monocytes Relative: 6.5 % (ref 3.0–12.0)
Neutro Abs: 2.1 10*3/uL (ref 1.4–7.7)
Neutrophils Relative %: 45.5 % (ref 43.0–77.0)
Platelets: 191 10*3/uL (ref 150.0–400.0)
RBC: 3.73 Mil/uL — ABNORMAL LOW (ref 3.87–5.11)
RDW: 14.3 % (ref 11.5–15.5)
WBC: 4.7 10*3/uL (ref 4.0–10.5)

## 2022-12-30 LAB — MAGNESIUM: Magnesium: 2 mg/dL (ref 1.5–2.5)

## 2022-12-30 NOTE — Telephone Encounter (Signed)
Pt called to make appt with Colin Mulders, said that MD stated she would be called and no one called her. The LOS from 12/6 does state referral to GEN [facility] and from my perspective it does not look like there is a referral in for genetics.  Appts have been scheduled with pt.

## 2022-12-30 NOTE — Telephone Encounter (Signed)
Crystal Erickson- please make referral to [facility] if there is not one put in. Crystal Erickson please see her as able

## 2022-12-31 DIAGNOSIS — M542 Cervicalgia: Secondary | ICD-10-CM | POA: Diagnosis not present

## 2022-12-31 DIAGNOSIS — M9903 Segmental and somatic dysfunction of lumbar region: Secondary | ICD-10-CM | POA: Diagnosis not present

## 2022-12-31 DIAGNOSIS — M9901 Segmental and somatic dysfunction of cervical region: Secondary | ICD-10-CM | POA: Diagnosis not present

## 2022-12-31 DIAGNOSIS — M9904 Segmental and somatic dysfunction of sacral region: Secondary | ICD-10-CM | POA: Diagnosis not present

## 2022-12-31 DIAGNOSIS — M722 Plantar fascial fibromatosis: Secondary | ICD-10-CM | POA: Diagnosis not present

## 2022-12-31 DIAGNOSIS — M9902 Segmental and somatic dysfunction of thoracic region: Secondary | ICD-10-CM | POA: Diagnosis not present

## 2023-04-19 ENCOUNTER — Other Ambulatory Visit: Payer: Self-pay | Admitting: Licensed Clinical Social Worker

## 2023-04-19 DIAGNOSIS — C189 Malignant neoplasm of colon, unspecified: Secondary | ICD-10-CM

## 2023-04-20 ENCOUNTER — Inpatient Hospital Stay: Payer: 59 | Attending: Licensed Clinical Social Worker | Admitting: Licensed Clinical Social Worker

## 2023-04-20 ENCOUNTER — Inpatient Hospital Stay: Payer: 59

## 2023-04-20 NOTE — Progress Notes (Deleted)
 REFERRING PROVIDER: Creig Hines, MD [address] [place],  [place] [address]  PRIMARY PROVIDER:  Sherlene Shams, MD  PRIMARY REASON FOR VISIT:  1. Adenocarcinoma of colon (HCC)   2. Family history of breast cancer      HISTORY OF PRESENT ILLNESS:   Crystal Erickson, a 63 y.o. female, was seen for a [facility] cancer genetics consultation at the request of Dr. Smith Robert due to a personal and family history of cancer.  Crystal Erickson presents to clinic today to discuss the possibility of a hereditary predisposition to cancer, genetic testing, and to further clarify her future cancer risks, as well as potential cancer risks for family members.   In 2024, at the age of 15, Crystal Erickson was diagnosed with stage I colon cancer. Tumor testing showed MMR IHC intact.   CANCER HISTORY:  Oncology History  Adenocarcinoma of colon (HCC)  11/20/2022 Initial Diagnosis   Adenocarcinoma of colon (HCC)   12/18/2022 Cancer Staging   Staging form: Colon and Rectum, AJCC 8th Edition - Pathologic stage from 12/18/2022: Stage I (pT2, pN0, cM0) - Signed by Creig Hines, MD on 12/18/2022 Total positive nodes: 0 Histologic grading system: 4 grade system Histologic grade (G): G2 Residual tumor (R): R0 - None    RISK FACTORS:  Menarche was at age ***.  First live birth at age ***.  Ovaries intact: {Yes/No-Ex:120004}.  Hysterectomy: {Yes/No-Ex:120004}.  Menopausal status: postmenopausal.  HRT use: {Numbers 1-12 multi-select:20307} years. Colonoscopy: yes Mammogram within the last year: yes.  Past Medical History:  Diagnosis Date   Colon cancer (HCC) 11/16/2022   Stage 1    Past Surgical History:  Procedure Laterality Date   COLON SURGERY  12/02/2022   Right Hemicolectomy   COLONOSCOPY WITH PROPOFOL N/A 11/16/2022   Procedure: COLONOSCOPY WITH PROPOFOL;  Surgeon: Midge Minium, MD;  Location: [facility];  Service: Endoscopy;  Laterality: N/A;  SPOT injected at ulcerative mass transverse colon  site    FAMILY HISTORY:  We obtained a detailed, 4-generation family history.  Significant diagnoses are listed below: Family History  Problem Relation Age of Onset   Mental illness Mother 82       alzheimer's   Cancer Mother        metastatic unknown primary   Diabetes Father    Breast cancer Maternal Aunt     Ms. Hegler is unaware of previous family history of genetic testing for hereditary cancer risks. There is no reported Ashkenazi Jewish ancestry. There is no known consanguinity.  GENETIC COUNSELING ASSESSMENT: Crystal Erickson is a 63 y.o. female with a personal history of colon cancer and family history of cancer which is somewhat suggestive of a hereditary cancer syndrome and predisposition to cancer. We, therefore, discussed and recommended the following at today's visit.   DISCUSSION: We discussed that approximately 10% of colorectal cancer is hereditary. Most cases of hereditary colorectal cancer are associated with Lynch syndrome genes, although there are other genes associated with hereditary cancer as well. Cancers and risks are gene specific. We discussed that testing is beneficial for several reasons including knowing about cancer risks, identifying potential screening and risk-reduction options that may be appropriate, and to understand if other family members could be at risk for cancer and allow them to undergo genetic testing.   We reviewed the characteristics, features and inheritance patterns of hereditary cancer syndromes. We also discussed genetic testing, including the appropriate family members to test, the process of testing, insurance coverage and turn-around-time  for results. We discussed the implications of a negative, positive and/or variant of uncertain significant result. We recommended Crystal Erickson pursue genetic testing for the [facility] CancerNext+RNA gene panel.   Based on Crystal Erickson personal and family history of cancer, she meets medical criteria for genetic  testing. Despite that she meets criteria, she may still have an out of pocket cost.   PLAN: After considering the risks, benefits, and limitations, Crystal Erickson provided informed consent to pursue genetic testing and the blood sample was sent to [facility] for analysis of the CancerNext+RNA panel. Results should be available within approximately 2-3 weeks' time, at which point they will be disclosed by telephone to Crystal Erickson, as will any additional recommendations warranted by these results. Crystal Erickson will receive a summary of her genetic counseling visit and a copy of her results once available. This information will also be available in Epic.   *** Despite our recommendation, Crystal Erickson did not wish to pursue genetic testing at today's visit. We understand this decision and remain available to coordinate genetic testing at any time in the future. We, therefore, recommend Crystal Erickson continue to follow the cancer screening guidelines given by her primary healthcare provider.  ***Based on Crystal Erickson's family history, we recommended her *** have genetic counseling and testing. Crystal Erickson will let us know if we can be of any assistance in coordinating genetic counseling and/or testing for this family member.   Crystal Erickson questions were answered to her satisfaction today. Our contact information was provided should additional questions or concerns arise. Thank you for the referral and allowing [place] to share in the care of your patient.   Lacy Duverney, MS, [facility] Genetic Counselor [email or web address] Phone: [phone]  *** minutes were spent on the date of the encounter in service to the patient including preparation, face-to-face consultation, documentation and care coordination. Dr. Blake Divine was available for discussion regarding this case.   _______________________________________________________________________ For Office Staff:  Number of people involved in  session: *** Was an Intern/ student involved with case: no

## 2023-05-10 ENCOUNTER — Encounter: Payer: Self-pay | Admitting: General Surgery

## 2023-06-17 ENCOUNTER — Ambulatory Visit: Admitting: General Surgery

## 2023-10-18 ENCOUNTER — Encounter: Payer: 59 | Admitting: Internal Medicine

## 2023-12-14 ENCOUNTER — Telehealth: Payer: Self-pay | Admitting: Oncology

## 2023-12-14 NOTE — Telephone Encounter (Signed)
 Pt called and left a vm stating she no longer has the insurance that [facility] accepts and she has established oncology care with [facility] and to cancel her upcoming appts on 12/5.

## 2023-12-17 ENCOUNTER — Other Ambulatory Visit: Payer: 59

## 2023-12-17 ENCOUNTER — Ambulatory Visit: Payer: 59 | Admitting: Oncology
